# Patient Record
Sex: Female | Born: 1955 | Hispanic: Yes | Marital: Married | Smoking: Never smoker
Health system: Southern US, Community
[De-identification: ages and names within clinical notes are randomized; demographics above are authoritative.]

## PROBLEM LIST (undated history)

## (undated) DIAGNOSIS — R7303 Prediabetes: Secondary | ICD-10-CM

## (undated) DIAGNOSIS — I10 Essential (primary) hypertension: Secondary | ICD-10-CM

## (undated) DIAGNOSIS — F419 Anxiety disorder, unspecified: Secondary | ICD-10-CM

## (undated) DIAGNOSIS — K2970 Gastritis, unspecified, without bleeding: Secondary | ICD-10-CM

## (undated) DIAGNOSIS — K219 Gastro-esophageal reflux disease without esophagitis: Secondary | ICD-10-CM

## (undated) HISTORY — PX: ABDOMINAL HYSTERECTOMY: SHX81

---

## 2005-08-19 ENCOUNTER — Ambulatory Visit: Payer: Self-pay | Admitting: Unknown Physician Specialty

## 2005-08-21 ENCOUNTER — Ambulatory Visit: Payer: Self-pay | Admitting: Unknown Physician Specialty

## 2008-08-02 ENCOUNTER — Ambulatory Visit: Payer: Self-pay | Admitting: Unknown Physician Specialty

## 2008-09-27 ENCOUNTER — Ambulatory Visit: Payer: Self-pay

## 2010-03-28 ENCOUNTER — Ambulatory Visit: Payer: Self-pay

## 2010-10-04 ENCOUNTER — Ambulatory Visit: Payer: Self-pay | Admitting: Rheumatology

## 2010-10-15 ENCOUNTER — Ambulatory Visit: Payer: Self-pay | Admitting: Rheumatology

## 2010-10-15 IMAGING — NM NUCLEAR MEDICINE WHOLE BODY BONE SCINTIGRAPHY
1 series · 12 of 14 positions shown, 15 images · non-contrast
Comparison: none

REASON FOR EXAM: WITH SPECT  severe pain abn Tspine MRI
COMMENTS:

[Series 4: 3d bone 1.25 b70s · axial · 0.98mm/px · z∈[+1446,+1741]mm · 12 of 500 slices shown, 15 images]
[im 39/500  soft-tissue]
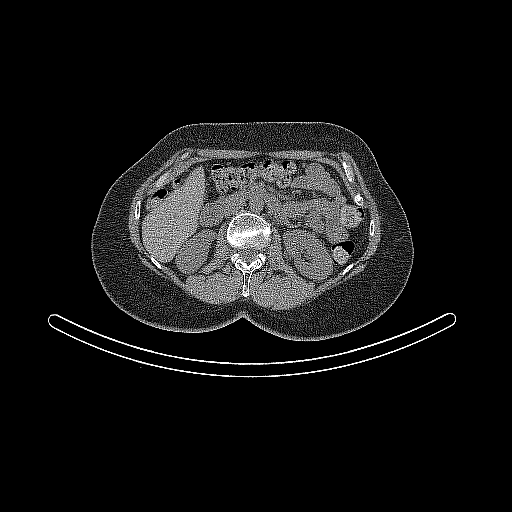
[im 39/500  bone]
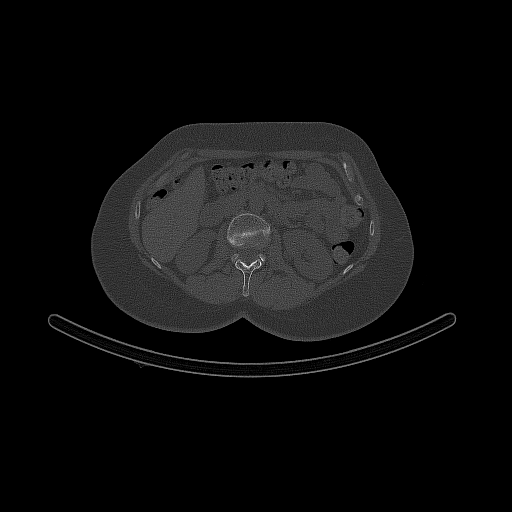
[im 77/500  bone]
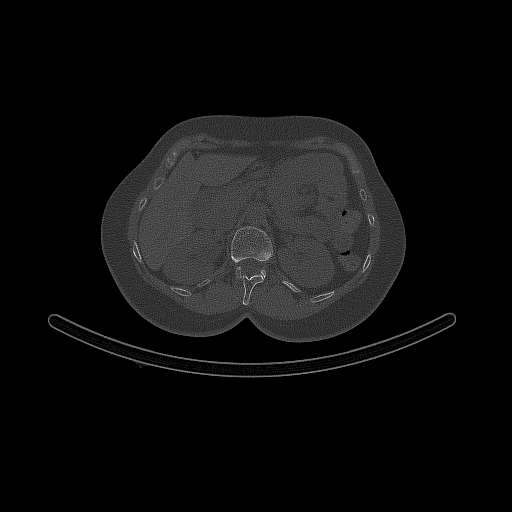
[im 116/500  bone]
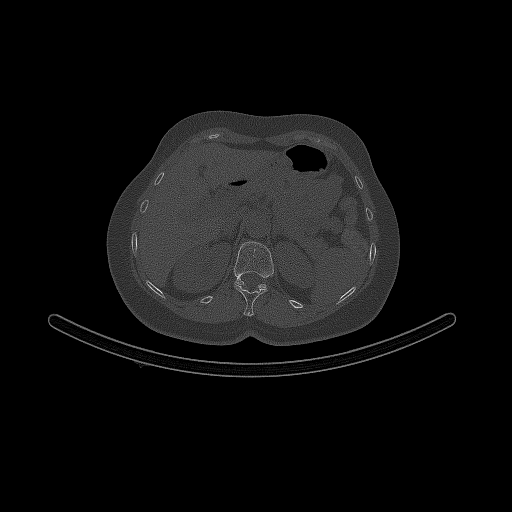
[im 154/500  bone]
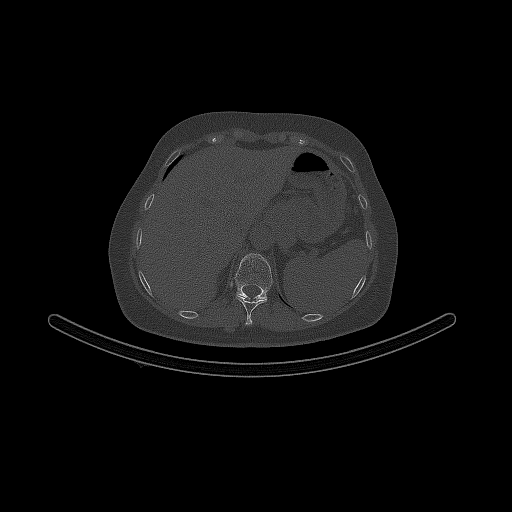
[im 192/500  soft-tissue]
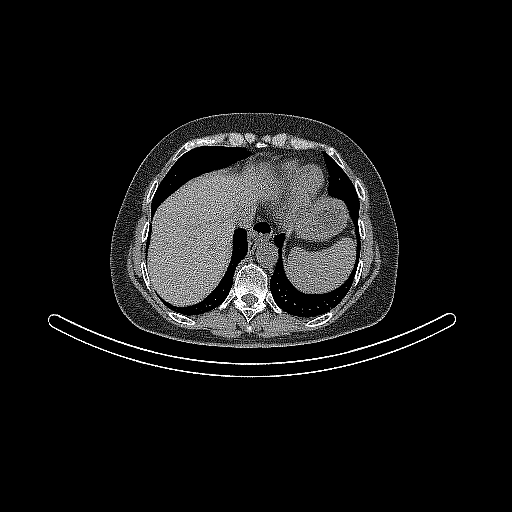
[im 192/500  bone]
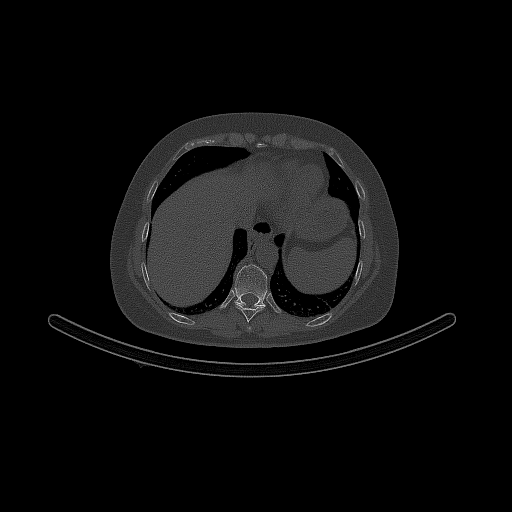
[im 231/500  bone]
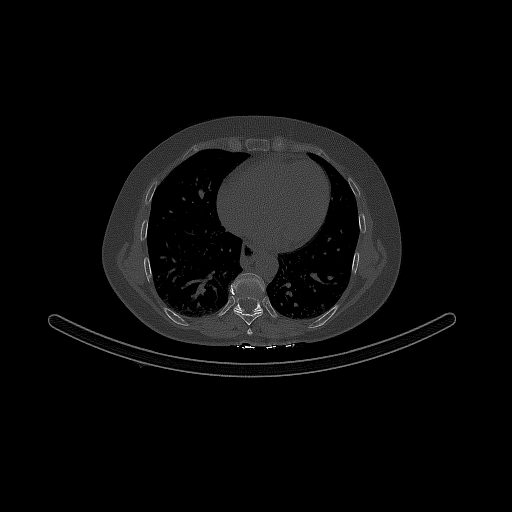
[im 269/500  bone]
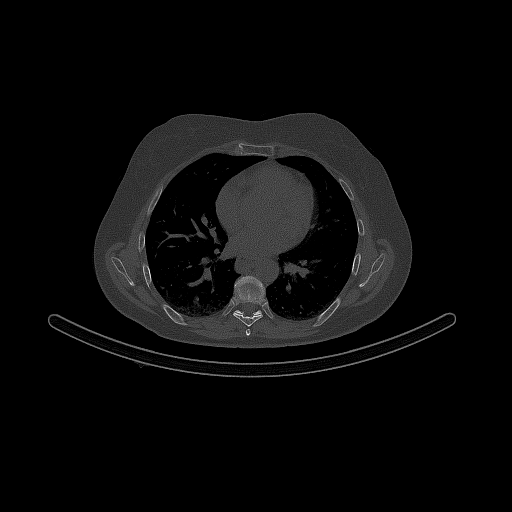
[im 308/500  bone]
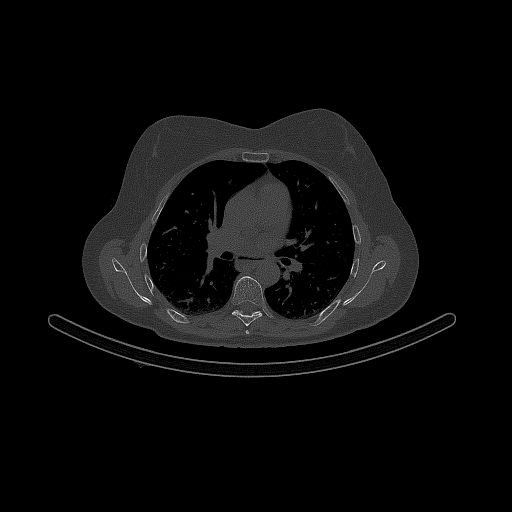
[im 346/500  soft-tissue]
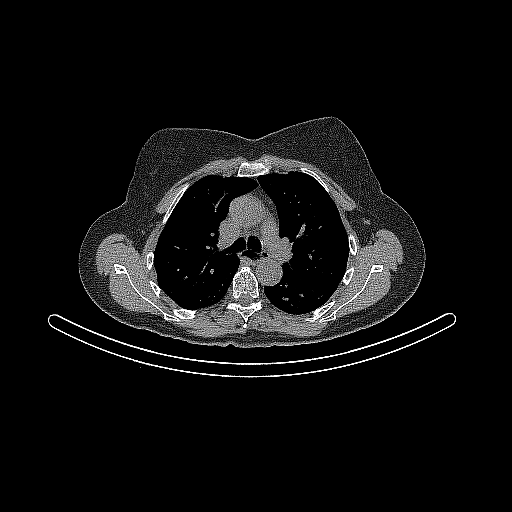
[im 346/500  bone]
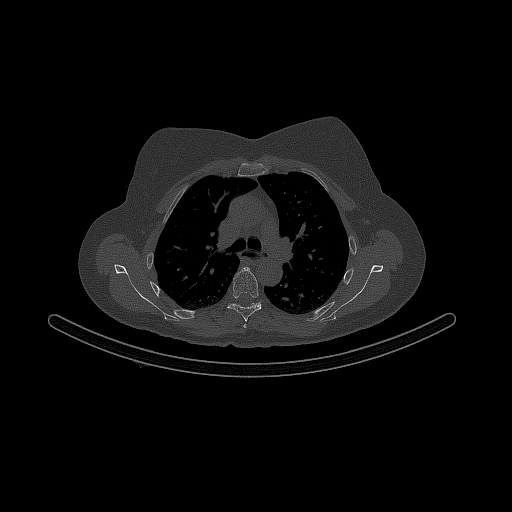
[im 384/500  bone]
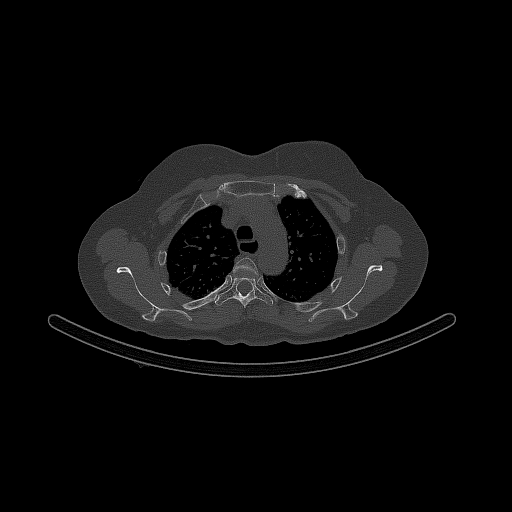
[im 423/500  bone]
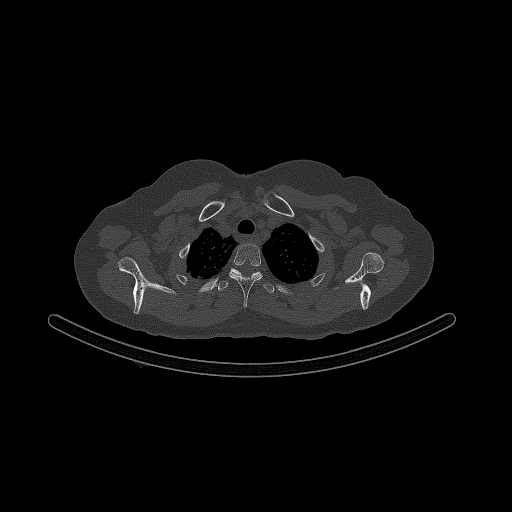
[im 461/500  bone]
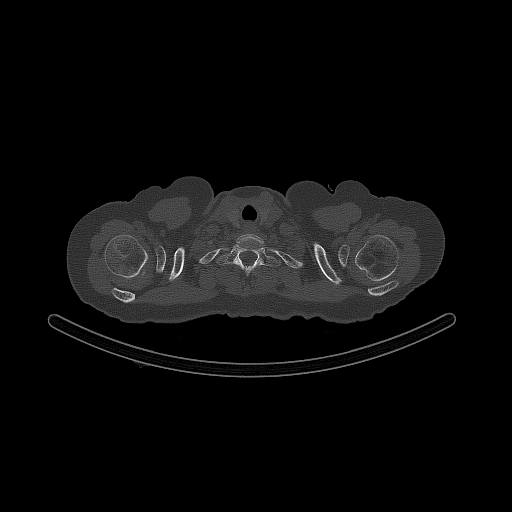

[12 of 14 positions shown; findings below may reference images not displayed]

PROCEDURE:     NM  - NM BONE WB 3 HR [DATE]  [DATE]

RESULT:     The patient was given a dose of 22.66 mCi of technetium 99m MDP.
Anterior and posterior whole body images with regional planar images in the
lateral view over the head and neck and oblique views over the abdomen and
pelvis were obtained. SPECT CT is performed over the thoracic region.
FINDINGS: Images show degenerative changes in the thoracic spine without
evidence of compression fracture or bony destruction. The bones do appear to
be somewhat osteopenic. Degenerative type increased localization is present
within the right ankle. There is no evidence of malignant uptake. There is
slightly increased localization in the ethmoid region consistent with sinus
disease. Degenerative uptake is seen in the shoulders.

Note is made of mild increased localization on the oblique views along the
superior aspect of the sacroiliac joints and again, most likely degenerative.
IMPRESSION: No evidence of thoracic compression deformity or
significant abnormal bony localization. Degenerative changes are noted in
the spine, shoulders and right ankle as well as the sacroiliac joints.

## 2011-04-15 ENCOUNTER — Observation Stay: Payer: Self-pay | Admitting: Internal Medicine

## 2011-04-15 LAB — CBC
HGB: 12.8 g/dL (ref 12.0–16.0)
MCH: 26.7 pg (ref 26.0–34.0)
MCHC: 32.5 g/dL (ref 32.0–36.0)
MCV: 82 fL (ref 80–100)
RBC: 4.8 10*6/uL (ref 3.80–5.20)
WBC: 5.9 10*3/uL (ref 3.6–11.0)

## 2011-04-15 LAB — COMPREHENSIVE METABOLIC PANEL
Alkaline Phosphatase: 55 U/L (ref 50–136)
Anion Gap: 13 (ref 7–16)
Bilirubin,Total: 0.8 mg/dL (ref 0.2–1.0)
Calcium, Total: 8.8 mg/dL (ref 8.5–10.1)
Co2: 27 mmol/L (ref 21–32)
Creatinine: 0.67 mg/dL (ref 0.60–1.30)
EGFR (Non-African Amer.): >60
Osmolality: 280 (ref 275–301)
Potassium: 3.9 mmol/L (ref 3.5–5.1)
SGPT (ALT): 21 U/L
Sodium: 141 mmol/L (ref 136–145)

## 2011-04-15 LAB — TROPONIN I
Troponin-I: <0.02 ng/mL
Troponin-I: <0.02 ng/mL

## 2011-04-15 LAB — CK TOTAL AND CKMB (NOT AT ARMC)
CK, Total: 74 U/L (ref 21–215)
CK, Total: 78 U/L (ref 21–215)
CK-MB: <0.5 ng/mL — ABNORMAL LOW (ref 0.5–3.6)

## 2011-04-15 LAB — TSH: Thyroid Stimulating Horm: 3.53 u[IU]/mL

## 2011-04-16 LAB — CBC WITH DIFFERENTIAL/PLATELET
Basophil %: 0.2 %
Eosinophil %: 0.2 %
HCT: 39.2 % (ref 35.0–47.0)
HGB: 12.9 g/dL (ref 12.0–16.0)
Lymphocyte #: 1.7 10*3/uL (ref 1.0–3.6)
Lymphocyte %: 21.3 %
MCH: 27.1 pg (ref 26.0–34.0)
MCV: 82 fL (ref 80–100)
Monocyte #: 0.7 10*3/uL (ref 0.0–0.7)
Monocyte %: 8.2 %
Neutrophil #: 5.7 10*3/uL (ref 1.4–6.5)
Platelet: 209 10*3/uL (ref 150–440)
RBC: 4.77 10*6/uL (ref 3.80–5.20)
RDW: 14.2 % (ref 11.5–14.5)
WBC: 8.1 10*3/uL (ref 3.6–11.0)

## 2011-04-16 LAB — BASIC METABOLIC PANEL
Anion Gap: 12 (ref 7–16)
Calcium, Total: 8.7 mg/dL (ref 8.5–10.1)
Chloride: 105 mmol/L (ref 98–107)
Co2: 28 mmol/L (ref 21–32)
EGFR (Non-African Amer.): >60
Osmolality: 287 (ref 275–301)
Potassium: 3.8 mmol/L (ref 3.5–5.1)

## 2011-04-16 LAB — TROPONIN I: Troponin-I: <0.02 ng/mL

## 2011-04-16 LAB — CK TOTAL AND CKMB (NOT AT ARMC): CK-MB: 0.6 ng/mL (ref 0.5–3.6)

## 2011-09-25 ENCOUNTER — Emergency Department: Payer: Self-pay | Admitting: Unknown Physician Specialty

## 2011-09-25 LAB — URINALYSIS, COMPLETE
Glucose,UR: NEGATIVE mg/dL (ref 0–75)
Nitrite: NEGATIVE
Ph: 8 (ref 4.5–8.0)
Protein: NEGATIVE
RBC,UR: <1 /HPF (ref 0–5)
Specific Gravity: 1.003 (ref 1.003–1.030)
WBC UR: 2 /HPF (ref 0–5)

## 2011-09-25 LAB — TROPONIN I: Troponin-I: <0.02 ng/mL

## 2011-09-25 LAB — CBC
MCH: 26.5 pg (ref 26.0–34.0)
MCHC: 32.5 g/dL (ref 32.0–36.0)
MCV: 82 fL (ref 80–100)
Platelet: 212 10*3/uL (ref 150–440)
RBC: 4.8 10*6/uL (ref 3.80–5.20)
RDW: 13.7 % (ref 11.5–14.5)

## 2011-09-25 LAB — COMPREHENSIVE METABOLIC PANEL
Alkaline Phosphatase: 85 U/L (ref 50–136)
Anion Gap: 10 (ref 7–16)
Bilirubin,Total: 0.7 mg/dL (ref 0.2–1.0)
Co2: 25 mmol/L (ref 21–32)
Creatinine: 0.71 mg/dL (ref 0.60–1.30)
EGFR (Non-African Amer.): >60
Osmolality: 278 (ref 275–301)
Sodium: 140 mmol/L (ref 136–145)

## 2012-04-23 ENCOUNTER — Emergency Department: Payer: Self-pay | Admitting: Emergency Medicine

## 2012-04-23 LAB — COMPREHENSIVE METABOLIC PANEL WITH GFR
Albumin: 3.6 g/dL
Alkaline Phosphatase: 88 U/L
Anion Gap: 5 — ABNORMAL LOW
BUN: 10 mg/dL
Bilirubin,Total: 0.9 mg/dL
Calcium, Total: 8.4 mg/dL — ABNORMAL LOW
Chloride: 107 mmol/L
Co2: 29 mmol/L
Creatinine: 0.6 mg/dL
EGFR (African American): >60
EGFR (Non-African Amer.): >60
Glucose: 116 mg/dL — ABNORMAL HIGH
Osmolality: 281
Potassium: 3.4 mmol/L — ABNORMAL LOW
SGOT(AST): 34 U/L
SGPT (ALT): 37 U/L
Sodium: 141 mmol/L
Total Protein: 7.6 g/dL

## 2012-04-23 LAB — CBC
HGB: 13 g/dL (ref 12.0–16.0)
MCH: 26.9 pg (ref 26.0–34.0)
MCHC: 33.1 g/dL (ref 32.0–36.0)
MCV: 81 fL (ref 80–100)
RBC: 4.82 10*6/uL (ref 3.80–5.20)
WBC: 9.5 10*3/uL (ref 3.6–11.0)

## 2012-05-05 ENCOUNTER — Emergency Department: Payer: Self-pay | Admitting: Emergency Medicine

## 2012-05-05 LAB — COMPREHENSIVE METABOLIC PANEL
Albumin: 4.1 g/dL (ref 3.4–5.0)
Alkaline Phosphatase: 89 U/L (ref 50–136)
Anion Gap: 4 — ABNORMAL LOW (ref 7–16)
Bilirubin,Total: 0.9 mg/dL (ref 0.2–1.0)
Calcium, Total: 9.2 mg/dL (ref 8.5–10.1)
Chloride: 107 mmol/L (ref 98–107)
Co2: 29 mmol/L (ref 21–32)
Creatinine: 0.71 mg/dL (ref 0.60–1.30)
EGFR (African American): >60
EGFR (Non-African Amer.): >60
Glucose: 128 mg/dL — ABNORMAL HIGH (ref 65–99)
Osmolality: 279 (ref 275–301)
SGOT(AST): 28 U/L (ref 15–37)
Sodium: 140 mmol/L (ref 136–145)

## 2012-05-05 LAB — CBC
HGB: 14.3 g/dL (ref 12.0–16.0)
MCH: 27 pg (ref 26.0–34.0)
MCHC: 33 g/dL (ref 32.0–36.0)
MCV: 82 fL (ref 80–100)
RBC: 5.3 10*6/uL — ABNORMAL HIGH (ref 3.80–5.20)

## 2012-05-05 LAB — TROPONIN I
Troponin-I: <0.02 ng/mL
Troponin-I: <0.02 ng/mL

## 2012-05-05 LAB — CK TOTAL AND CKMB (NOT AT ARMC): CK, Total: 65 U/L (ref 21–215)

## 2013-01-23 ENCOUNTER — Emergency Department: Payer: Self-pay | Admitting: Emergency Medicine

## 2013-01-23 LAB — CBC
HCT: 39.9 % (ref 35.0–47.0)
HGB: 13.5 g/dL (ref 12.0–16.0)
MCH: 27.5 pg (ref 26.0–34.0)
MCHC: 33.7 g/dL (ref 32.0–36.0)
MCV: 82 fL (ref 80–100)
Platelet: 157 10*3/uL (ref 150–440)
RBC: 4.9 10*6/uL (ref 3.80–5.20)
RDW: 13.9 % (ref 11.5–14.5)

## 2013-01-23 LAB — BASIC METABOLIC PANEL
Anion Gap: 7 (ref 7–16)
BUN: 4 mg/dL — ABNORMAL LOW (ref 7–18)
Creatinine: 0.94 mg/dL (ref 0.60–1.30)
Potassium: 3.6 mmol/L (ref 3.5–5.1)
Sodium: 136 mmol/L (ref 136–145)

## 2014-05-22 NOTE — Consult Note (Signed)
PATIENT NAME:  Sarah Tapia, Sarah Tapia MR#:  [id number] DATE OF BIRTH:  03-12-1955  DATE OF CONSULTATION:  04/16/2011  REFERRING PHYSICIAN:  Dr. Delfino Tapia  CONSULTING PHYSICIAN:  Sarah Canche K. Sarah Burger, MD  PRIMARY CARE PHYSICIAN: Dr. Terance Tapia  REASON FOR CONSULTATION: Syncope.   HISTORY OF PRESENT ILLNESS: Sarah Tapia is a 59 year old right-handed Hispanic female who was working at [employer] and felt like some unusual sensation, felt like she was going to urinate and defecate on herself and passed out. She had some loss of consciousness for unknown period of time per her. The last thing she remembers is coworkers around her and once she came around the first thing she remembers is laying in the ambulance and she had on and off consciousness. She remembers here in the ER the events from being in the ER.   Patient had CT scan of the head done which was unremarkable.   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 She does not remember the details. Her parents have died and the physician taking care of her also died in [place]   Patient does not remember the name of the medication but she was supposed to be on seizure medication and she used to have generalized tonic-clonic seizures.   Patient does have occasional episodes of feeling of loss of time but she has not had episode waking up in unusual places or unexplained injuries or tongue bites, etc.   During this particular episode she did not have any tongue bite or loss of bowel or bladder.   She does not have any birthmark except she has interscapular skin changes which has been growing in size so she is going to see a dermatologist as an outpatient.   Patient also had a mass on her shin of her leg which was recently removed which was supposedly benign.    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   She felt like the surgery at [facility] did help her.   She has severe osteoporosis and receives IV Reclast.   Patient does not have family history of epilepsy or mental condition or any other neurological condition and she felt like her birth was normal. She did receive all immunizations.    Her development was okay. She does not have any other birthmarks as described above.   She also has a history of occasional dizziness off and on with just room spinning sensation and it has been going on for long time. It is not associated with nausea and vomiting.   Patient also carries a diagnosis of anxiety and depression and takes medication for this.   PAST MEDICAL HISTORY:  1. Osteoporosis.  2. Hypertension.  3. Gastroesophageal reflux disease.  4. Chronic seasonal allergies.   PAST SURGICAL HISTORY:  1. Left leg benign mass removal.  2. Status post esophagectomy.   ALLERGIES: She is allergic to penicillin and aspirin.   CURRENT MEDICATION LIST: 1. Flonase. 2. Vicodin p.r.n.   SOCIAL HISTORY: Significant that she does not smoke, does not drink alcohol, never did drugs. She works at [employer].   FAMILY HISTORY: Significant that mother and father had coronary disease and they have been deceased as well as diabetes.   REVIEW OF SYSTEMS: Positive for feeling generalized tiredness and feeling of dizziness and dryness in her mouth, just feels uneasy  and head heavy sensation. She does not have generalized aching. Her 10 system review of system was asked and was found to be negative.   PHYSICAL EXAMINATION:  VITAL SIGNS: Temperature 97.7, pulse 68, respiratory rate 20, blood pressure 124/80, pulse oximetry 97%.   She is alert, oriented. She followed two-step inverted commands. Her attention, concentration, and memory seems to be appropriate for her age and medical condition.   She does not have any delusions, hallucinations.   She does have good mood. She did have some  language fluency problem but I think it is more likely due to her using English as a second language.   CRANIAL NERVES: Pupils were equal, round, and reactive. Extraocular movements were intact. Her face was symmetric. Tongue was midline. Facial sensations were intact. Her hearing seemed to be intact.   On her motor exam she has normal tone and strength of 5/5.   She has deep tendon reflexes +2. She has negative Hoffman. Her toes are mute.   I did not check her gait but her coordination seems to be intact.   GENERAL: She does have a small scar on her shin on the left side as well as she has abnormal pigmentation between her intrascapular region.   She has abdominal scars old healed from her esophageal surgery.   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   She does not have myoclonic jerk or any neurocutaneous stigmata.   She does not have positive family history of seizures.   But she has episodes of loss of time before this event and some type of aura. Cannot rule out seizure as a cause of her passing out spells.   We should consider doing the work-up as Sarah Tapia from the hospitalist team has thought of. I agree with doing MRI of the brain epilepsy protocol and EEG.   This test can be done as an outpatient visit.   For now will start her on levetiracetam 500 mg twice a day.   Patient has all the labs done which have been unremarkable so far.   Patient should be advised on driving restriction for at least six months since her first episode.   It is not clear at this point that her spell represented seizure.   In two weeks at her clinic visit we will make further decision.   As a part of her syncopal work-up she is wearing Holter monitor. She should have orthostatic vitals checked.   I will see her as an outpatient in two weeks. Feel free to contact me with any further  questions.  ____________________________ Durene CalHemang K. Sarah BurgerShah, MD hks:cms D: 04/16/2011 21:59:11 ET T: 04/17/2011 09:57:55 ET JOB#: [id number]  cc: Teagen Mcleary K. Sarah BurgerShah, MD, <Dictator> Durene CalHEMANG K [facility] MD ELECTRONICALLY SIGNED 04/29/2011 15:06

## 2014-05-22 NOTE — Consult Note (Signed)
Past Medical/Surgical Hx:  Osteoporosis:   Esophageal Dilation:   Hysterectomy - Partial:   Home Medications: Medication Instructions Last Modified Date/Time  Reclast 5 mg/100 mL intravenous solution  intravenous  18-Mar-13 11:47  Os-Cal 500 (1250 mg calcium carbonate) oral tablet tab(s) orally once a day 18-Mar-13 11:47  Multi-Day Plus Minerals oral tablet 1 tab(s) orally once a day 18-Mar-13 18:51  Vitamin D3 2000 intl units oral tablet 1 tab(s) orally once a day 18-Mar-13 18:51  Flonase 50 mcg/inh nasal spray 1 spray(s) nasal once a day (at bedtime) 18-Mar-13 18:51   Allergies:  Aspirin: Unknown  Penicillin: Unknown  Vital Signs: **Vital Signs.:   19-Mar-13 14:09   Vital Signs Type Routine   Temperature Temperature (F) 97.6   Celsius 36.4   Temperature Source oral   Pulse Pulse 72   Pulse source per Dinamap   Respirations Respirations 20   Systolic BP Systolic BP 494   Diastolic BP (mmHg) Diastolic BP (mmHg) 92   Mean BP 106   BP Source Dinamap   Pulse Ox % Pulse Ox % 98   Pulse Ox Activity Level  At rest   Oxygen Delivery Room Air/ 21 %   Lab Results:  Thyroid:  18-Mar-13 12:05    Thyroid Stimulating Hormone 3.53  Hepatic:  18-Mar-13 12:05    Bilirubin, Total 0.8   Alkaline Phosphatase 55   SGPT (ALT) 21   SGOT (AST) 26   Total Protein, Serum 7.9   Albumin, Serum 4.0  Routine Chem:  19-Mar-13 05:18    Glucose, Serum   101   BUN 7   Creatinine (comp) 0.68   Sodium, Serum 145   Potassium, Serum 3.8   Chloride, Serum 105   CO2, Serum 28   Calcium (Total), Serum 8.7   Anion Gap 12   Osmolality (calc) 287   eGFR (African American) >60   eGFR (Non-African American) >60   Hemoglobin A1c ([facility]) 5.8  Cardiac:  19-Mar-13 05:18    Troponin I < 0.02   CK, Total 68   CPK-MB, Serum 0.6  Routine Hem:  19-Mar-13 05:18    WBC (CBC) 8.1   RBC (CBC) 4.77   Hemoglobin (CBC) 12.9   Hematocrit (CBC) 39.2   Platelet Count (CBC) 209   MCV 82   MCH 27.1   MCHC  33.0   RDW 14.2   Neutrophil % 70.1   Lymphocyte % 21.3   Monocyte % 8.2   Eosinophil % 0.2   Basophil % 0.2   Neutrophil # 5.7   Lymphocyte # 1.7   Monocyte # 0.7   Eosinophil # 0.0   Basophil # 0.0   Radiology Results: [place]:    18-Mar-13 17:03, US Carotid Doppler Bilateral   US Carotid Doppler Bilateral    REASON FOR EXAM:    syncope  COMMENTS:       PROCEDURE: [place]  - US CAROTID DOPPLER BILATERAL  - Apr 15 2011  5:03PM     RESULT: Indication: Syncope    Comparison: None    Technique: Gray-scale, color Doppler, and spectral Doppler images were   obtainedof the extracranial carotid artery systems and vertebral   arteries in the neck.    Findings:  On the right, there is no significant atherosclerotic plaque. Maximum     peak systolic velocity in the right CCA is 70 cm/second. Maximum peak   systolic velocity in the right ICA is 84 cm/second. Maximum peak systolic   velocity in the right  ECA is 78 cm/second. The right ICA/CCA ratio is   1.26. This corresponds to a stenosis of less than 50 %. Antegrade blood   flow is documented in the right vertebral artery.    On the left, there is no significant atherosclerotic plaque. Maximum peak   systolic velocity in the left CCA is 86 cm/second. Maximum peak systolic   velocity in the left ICA is 97 cm/second. Maximum peak systolic velocity   in the left ECA is 58 cm/second. The left ICA/CCA ratio is 1.04. This   corresponds to a stenosis of less than 50 %. Antegrade blood flow is   documented in the left vertebral artery.    IMPRESSION:    1. No hemodynamically significant carotid artery stenosis.    Verified By: Jennette Banker, M.D., MD  CT:    18-Mar-13 16:26, CT Head Without Contrast   CT Head Without Contrast    REASON FOR EXAM:    syncope  COMMENTS:       PROCEDURE: CT  - CT HEAD WITHOUT CONTRAST  - Apr 15 2011  4:26PM     RESULT: Comparison:  None    Technique: Multiple axial images from the foramen magnum to the  vertex   were obtained without IV contrast.    Findings:      There is no evidence of mass effect, midline shift, or extra-axial fluid   collections.  There is no evidence of a space-occupying lesion or   intracranial hemorrhage. There is no evidence of a cortical-based area of     acute infarction.      The ventricles and sulci are appropriate for the patient's age. The basal   cisterns are patent.    Visualized portions of the orbits are unremarkable. The visualized   portions of the paranasal sinuses and mastoid air cells are unremarkable.     The osseous structures are unremarkable.    IMPRESSION:      No acute intracranial process.        Verified By: Jennette Banker, M.D., MD   Impression/Recommendations:  Recommendations:   Please see my dictation for details.[id number] Syncopal spell, can't rule out seizure, will start levetiracetam 500 mg po bid empirically. 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 for the opportunity to participate in care of your patient.free to contact me with any further questions on this pt.will follow this patient with you during their hospitalization.   Electronic Signatures: Ray Church (MD)  (Signed 19-Mar-13 21:59)  Authored: PAST MEDICAL/SURGICAL HISTORY, HOME MEDICATIONS, ALLERGIES, NURSING VITAL SIGNS, LAB RESULTS, RADIOLOGY RESULTS, Recommendations   Last Updated: 19-Mar-13 21:59 by Ray Church (MD)

## 2014-05-22 NOTE — Discharge Summary (Signed)
PATIENT NAME:  Sarah PlowmanCRUZ, Niaya MR#:  [id number] DATE OF BIRTH:  [birth year]  DATE OF ADMISSION:  04/15/2011 DATE OF DISCHARGE:  04/17/2011  DISCHARGE DIAGNOSES:  1. Syncope. Cannot rule out possible seizure. Complete neurological work-up including magnetic resonance imaging of the brain, electroencephalogram and echocardiogram remained negative.  2. Hyperglycemia, likely stress-induced, hemoglobin A1c less than 6.    SECONDARY DIAGNOSES:  1. Osteoporosis.  2. Hypertension.  3. Gastroesophageal reflux disease.   CONSULTATION: Neurology Dr. Cristopher PeruHemang Horton [place].   LABORATORY, DIAGNOSTIC AND RADIOLOGICAL DATA: EEG on 03/20 showed normal record, no obvious seizure activity.   CT scan of the head without contrast on 03/18 showed no acute intracranial process.   Carotid Doppler's bilaterally on 03/18 showed no hemodynamically significant carotid stenosis.   MRI of the brain without contrast on 03/20 showed no acute abnormality.   2-D echocardiogram on 03/19 showed normal echocardiogram. Trace mitral regurgitation.   HISTORY AND SHORT HOSPITAL COURSE: Patient is a 59 year old female with above-mentioned medical problems was admitted for possible syncope. She was felt to have possible vertigo, was started on meclizine but did not get much benefit. Please see Dr. Serita GritShreyang Patel's dictated history and physical for further details. On further evaluation it was found to be known history of seizure as a child and was on medication until high school and her history was concerning for possible seizure so neurology consultation was obtained with Dr. Cristopher PeruHemang Rian Koon who recommended full neurological work-up which was obtained with EEG, MRI and bilateral carotid Doppler's which were all negative. She was started on Keppra, was feeling much better on 03/20 and is being discharged home in stable condition. On the date of discharge she was not having dizzy spell and was feeling close to baseline.   PHYSICAL EXAMINATION:  VITAL  SIGNS: On the date of discharge her vital signs are as follows: Temperature 97.8, heart rate 71 per minute, respirations 18 per minute, blood pressure 146/90 mmHg. She was saturating 99% on room air. Pertinent Physical Examination: CARDIOVASCULAR: S1, S2 normal. No murmur, rubs, or gallop. LUNGS: Clear to auscultation bilaterally. No wheezing, rales, rhonchi, crepitation. ABDOMEN: Soft, benign. NEUROLOGIC: Nonfocal examination. All other physical examination remained at the baseline.   DISCHARGE MEDICATIONS:  1. Reclast 5 mg IV as she has been taking at home.  2. Os-Cal 500 mg 1 tablet p.o. daily.  3. Multivitamin once daily.  4. Vitamin D3 2000 international units once daily.  5. Flonase one spray at bedtime.  6. Keppra 500 mg p.o. b.i.d.   DISCHARGE DIET: Regular.   DISCHARGE ACTIVITY: As tolerated.   DISCHARGE INSTRUCTIONS AND FOLLOW UP: Patient was instructed to follow up with her primary care physician, Dr. Dorothey Basemanavid Bronstein, in 2 to 3 weeks. She will need follow up with Dr. Crist[facility] PeruHemang [facility] as scheduled.   TOTAL TIME DISCHARGING THIS PATIENT: 55 minutes.   ____________________________ Ellamae SiaVipul S. Sherryll BurgerShah, MD vss:cms D: 04/17/2011 21:45:23 ET T: 04/18/2011 10:43:25 ET JOB#: [id number]  cc: Sota Hetz S. Sherryll BurgerShah, MD, <Dictator> Teena Iraniavid M. Terance HartBronstein, MD Hemang K. Sherryll BurgerShah, MD Ellamae SiaVIPUL S [facility] MD ELECTRONICALLY SIGNED 04/18/2011 12:11

## 2014-05-22 NOTE — H&P (Signed)
PATIENT NAME:  Sarah Tapia, PETRIDES MR#:  [id number] DATE OF BIRTH:  10-19-1955  DATE OF ADMISSION:  04/15/2011  PRIMARY CARE PHYSICIAN: Dr. Terance Hart  ED REFERRING PHYSICIAN: Dr. Clemens Catholic   CHIEF COMPLAINT: Syncope with collapse.   HISTORY OF PRESENT ILLNESS: The patient is a 59 year old Spanish female who reports that she has had a problem with her blood pressure going up and down over the past few months. She has been followed by Dr. Terance Hart, is not currently on any regimen. She reports that she has also been feeling dizzy for the past few months. She starts feeling like the room is a spinning and feels like passing out but she never passes out. There is no relationship to a standing position or sitting or lying. She gets these symptoms. She also has been weak and sleepy over the past few weeks. She today was working at [employer] when she started to have this spell where she started to feel dizzy and felt like the room was spinning. She felt like she was going to have urine and stool incontinence and then passed out. There was no noted seizure activity. The patient did pass out and apparently her coworker was nearby and did not let her fall. EMS was called. She started waking up later once EMS picked her up. There was no seizure activity noted. She also complains of some headache. She denies any visual difficulties. No double vision. She denies any ringing in her ears. No hearing difficulties. She denies any chest pains. She reports that when she starts feeling dizzy she starts having a feeling of shortness of breath.  She denies any abdominal pain, nausea, vomiting, or diarrhea. She denies any urinary frequency, urgency, or hesitancy. She complains of having pain in her left lower leg where she had a lesion that was benign, was resected. She otherwise denies any urinary symptoms.   PAST MEDICAL HISTORY:  1. Osteoporosis.  2. Hypertension.  3. Gastroesophageal reflux disease.  4. Chronic seasonal allergies.    PAST SURGICAL HISTORY:  1. History of surgery for left leg mass which was benign.  2. Status post esophagectomy.  ALLERGIES: Penicillin and aspirin.   CURRENT MEDICATIONS: She reports that she only takes Flonase and Vicodin p.r.n.   SOCIAL HISTORY: Does not smoke. Does not drink. No drugs.   FAMILY HISTORY: Mother and father died of coronary artery disease and diabetes.   REVIEW OF SYSTEMS:  CONSTITUTIONAL: Denies any fevers. Complains of fatigue, weakness. No pain. No weight loss. No weight gain. EYES: No blurred or double vision. No pain. No redness. No inflammation. No glaucoma. No cataracts. ENT: No tinnitus. No ear pain. No hearing loss. No seasonal or year-round allergies. No epistaxis. No nasal discharge. No snoring. No postnasal drip. No sinus pain. No redness of the oropharynx. No difficulty swallowing. RESPIRATORY: No cough. No wheezing. No hemoptysis. No dyspnea. No painful respirations. No chronic obstructive pulmonary disease. No tuberculosis. No pneumonia. CARDIOVASCULAR: No chest pain. No orthopnea. No edema. No arrhythmia. No palpitations. Syncope as above. GASTROINTESTINAL: No nausea, vomiting, or diarrhea. No abdominal pain. No hematemesis. No melena. No ulcers. No gastroesophageal reflux disease. No irritable bowel syndrome. No jaundice. No changes in bowel habits. GENITOURINARY: Denies any dysuria, hematuria, renal calculus, or frequency. ENDO: Denies any polyuria, nocturia, or thyroid problems. HEME/LYMPH: Denies any anemia, easy bruisability, or bleeding. SKIN: No acne. No rash. No changes in mole, hair, or skin. MUSCULOSKELETAL: Complains of pain in her back related, according to her, to osteoporosis. No  gout. NEUROLOGIC: No numbness. No weakness. No cerebrovascular accident. No transient ischemic attack. No seizures. PSYCHIATRIC: Denies anxiety, insomnia, or ADD. No OCD. No bipolar depression.   PHYSICAL EXAMINATION:  VITAL SIGNS: Temperature 98, pulse 82, respirations 18,  blood pressure 158/93, O2 99%.   GENERAL: The patient appears her stated age in no acute distress.   HEENT: Head atraumatic, normocephalic. Pupils are equal, round, reactive to light and accommodation. There is no conjunctival pallor. No scleral icterus. She has no nystagmus but with vertical eye movements she does start feeling dizzy. Oropharynx is clear without any exudates. Nasal exam shows no ulceration or drainage. Ear exam shows no drainage or erythema.   NECK: No thyromegaly. No carotid bruits.   CARDIOVASCULAR: Regular rate and rhythm. No murmurs, rubs, clicks, or gallops. PMI is not displaced.   LUNGS: Clear to auscultation bilaterally without any rales, rhonchi, or wheezing.   ABDOMEN: Soft, nontender, nondistended. Positive bowel sounds times four.   EXTREMITIES: No clubbing, cyanosis, or edema.   NEUROLOGICAL: The patient is awake, alert, oriented times three. No focal deficits.   SKIN: No rash.   LYMPHATIC: No lymph nodes palpable.   VASCULAR: Good DP, PT pulses.   PSYCHIATRIC: Not anxious or depressed.   LABORATORY, DIAGNOSTIC, AND RADIOLOGICAL DATA: Blood glucose 121, BUN 6, creatinine 0.67, sodium 141, potassium 3.9, chloride 101, CO2 27, calcium 8.8. LFTs were normal. Troponin less than 0.02. WBC 5.9, hemoglobin 12.8, platelet count 223. EKG normal sinus rhythm without any ST-T wave changes.   ASSESSMENT AND PLAN: The patient is a 59 year old Spanish female who presents with syncope.  1. Syncope with collapse: At this time we will place the patient on observation. We will do an echocardiogram and check carotids. The patient also may need outpatient Holter.  2. Dizziness, vertigo: Possibly due to benign positional vertigo. We will try meclizine. May need outpatient ENT evaluation due to her symptom of collapse. We will go ahead and get a CT scan of the head. May need MRI of the brain.  3. Hypertension: Not on any treatment currently. We will monitor her blood pressure.   4. Slightly elevated blood glucose: We will check a hemoglobin A1c.  5. Osteoporosis: The patient gets IV infusion yearly.  6. Miscellaneous: We will hold off on any deep vein thrombosis prophylaxis since she is ambulatory.     TIME SPENT: 35 minutes.   ____________________________ Lacie ScottsShreyang H. Allena KatzPatel, MD shp:bjt D: 04/15/2011 16:24:14 ET T: 04/15/2011 17:13:03 ET JOB#: [id number]  cc: Sarahlynn Cisnero H. Allena KatzPatel, MD, <Dictator> Teena Iraniavid M. Terance HartBronstein, MD Charise CarwinSHREYANG H Emmajean Ratledge MD ELECTRONICALLY SIGNED 04/17/2011 21:59

## 2014-09-14 ENCOUNTER — Other Ambulatory Visit: Payer: Self-pay | Admitting: Obstetrics and Gynecology

## 2014-09-14 DIAGNOSIS — Z1231 Encounter for screening mammogram for malignant neoplasm of breast: Secondary | ICD-10-CM

## 2014-09-22 ENCOUNTER — Ambulatory Visit
Admission: RE | Admit: 2014-09-22 | Discharge: 2014-09-22 | Disposition: A | Discharge status: Home / Self Care | Payer: BLUE CROSS/BLUE SHIELD | Source: Ambulatory Visit | Attending: Obstetrics and Gynecology | Admitting: Obstetrics and Gynecology

## 2014-09-22 DIAGNOSIS — Z1231 Encounter for screening mammogram for malignant neoplasm of breast: Secondary | ICD-10-CM | POA: Diagnosis present

## 2014-10-04 DIAGNOSIS — Z8719 Personal history of other diseases of the digestive system: Secondary | ICD-10-CM | POA: Insufficient documentation

## 2014-10-04 DIAGNOSIS — K5900 Constipation, unspecified: Secondary | ICD-10-CM | POA: Insufficient documentation

## 2015-04-20 ENCOUNTER — Emergency Department
Admission: EM | Admit: 2015-04-20 | Discharge: 2015-04-20 | Disposition: A | Discharge status: Home / Self Care | Payer: BLUE CROSS/BLUE SHIELD | Attending: Emergency Medicine | Admitting: Emergency Medicine

## 2015-04-20 ENCOUNTER — Encounter: Payer: Self-pay | Admitting: Emergency Medicine

## 2015-04-20 DIAGNOSIS — R42 Dizziness and giddiness: Secondary | ICD-10-CM | POA: Diagnosis present

## 2015-04-20 DIAGNOSIS — I10 Essential (primary) hypertension: Secondary | ICD-10-CM | POA: Diagnosis not present

## 2015-04-20 HISTORY — DX: Essential (primary) hypertension: I10

## 2015-04-20 HISTORY — DX: Gastritis, unspecified, without bleeding: K29.70

## 2015-04-20 HISTORY — DX: Gastro-esophageal reflux disease without esophagitis: K21.9

## 2015-04-20 LAB — BASIC METABOLIC PANEL
ANION GAP: 6 (ref 5–15)
BUN: 7 mg/dL (ref 6–20)
CO2: 28 mmol/L (ref 22–32)
Calcium: 9.2 mg/dL (ref 8.9–10.3)
Chloride: 102 mmol/L (ref 101–111)
Creatinine, Ser: 0.62 mg/dL (ref 0.44–1.00)
GFR calc Af Amer: >60 mL/min (ref >60)
Glucose, Bld: 112 mg/dL — ABNORMAL HIGH (ref 65–99)
POTASSIUM: 3.9 mmol/L (ref 3.5–5.1)
SODIUM: 136 mmol/L (ref 135–145)

## 2015-04-20 LAB — URINALYSIS COMPLETE WITH MICROSCOPIC (ARMC ONLY)
BACTERIA UA: NONE SEEN
BILIRUBIN URINE: NEGATIVE
Glucose, UA: NEGATIVE mg/dL
HGB URINE DIPSTICK: NEGATIVE
Ketones, ur: NEGATIVE mg/dL
LEUKOCYTES UA: NEGATIVE
NITRITE: NEGATIVE
PH: 7 (ref 5.0–8.0)
Protein, ur: NEGATIVE mg/dL
SPECIFIC GRAVITY, URINE: 1.004 — AB (ref 1.005–1.030)

## 2015-04-20 LAB — CBC
HEMATOCRIT: 39.7 % (ref 35.0–47.0)
HEMOGLOBIN: 13.3 g/dL (ref 12.0–16.0)
MCH: 26.6 pg (ref 26.0–34.0)
MCHC: 33.4 g/dL (ref 32.0–36.0)
MCV: 79.5 fL — ABNORMAL LOW (ref 80.0–100.0)
Platelets: 239 10*3/uL (ref 150–440)
RBC: 5 MIL/uL (ref 3.80–5.20)
RDW: 14 % (ref 11.5–14.5)
WBC: 6.9 10*3/uL (ref 3.6–11.0)

## 2015-04-20 LAB — GLUCOSE, CAPILLARY: Glucose-Capillary: 97 mg/dL (ref 65–99)

## 2015-04-20 MED ORDER — MECLIZINE HCL 25 MG PO TABS: 25.0000 mg | ORAL_TABLET | Freq: Three times a day (TID) | ORAL | Status: DC | PRN

## 2015-04-20 MED ORDER — ALPRAZOLAM 0.5 MG PO TABS
0.5000 mg | ORAL_TABLET | Freq: Once | ORAL | Status: AC
  Administered 2015-04-20: 0.5 mg via ORAL

## 2015-04-20 MED ORDER — ONDANSETRON HCL 4 MG/2ML IJ SOLN
4.0000 mg | Freq: Once | INTRAMUSCULAR | Status: AC
  Administered 2015-04-20: 4 mg via INTRAVENOUS
  Filled 2015-04-20: qty 2

## 2015-04-20 MED ORDER — SODIUM CHLORIDE 0.9 % IV BOLUS (SEPSIS)
1000.0000 mL | Freq: Once | INTRAVENOUS | Status: AC
  Administered 2015-04-20: 1000 mL via INTRAVENOUS

## 2015-04-20 MED ORDER — ONDANSETRON HCL 4 MG PO TABS: 4.0000 mg | ORAL_TABLET | Freq: Three times a day (TID) | ORAL | Status: DC | PRN

## 2015-04-20 MED ORDER — ALPRAZOLAM 0.5 MG PO TABS
ORAL_TABLET | ORAL | Status: AC
  Administered 2015-04-20: 0.5 mg via ORAL
  Filled 2015-04-20: qty 1

## 2015-04-20 MED ORDER — MECLIZINE HCL 25 MG PO TABS
50.0000 mg | ORAL_TABLET | Freq: Once | ORAL | Status: AC
  Administered 2015-04-20: 50 mg via ORAL
  Filled 2015-04-20: qty 2

## 2015-04-20 NOTE — Discharge Instructions (Signed)
You were evaluated for dizziness which I suspect is due to peripheral vertigo.  Return to the emergency department for any worsening symptoms including any weakness, numbness, slurred speech, facial drooping, confusion or altered mental status, passing out, or seizure activity.   Benign Positional Vertigo Vertigo is the feeling that you or your surroundings are moving when they are not. Benign positional vertigo is the most common form of vertigo. The cause of this condition is not serious (is benign). This condition is triggered by certain movements and positions (is positional). This condition can be dangerous if it occurs while you are doing something that could endanger you or others, such as driving.  CAUSES In many cases, the cause of this condition is not known. It may be caused by a disturbance in an area of the inner ear that helps your brain to sense movement and balance. This disturbance can be caused by a viral infection (labyrinthitis), head injury, or repetitive motion. RISK FACTORS This condition is more likely to develop in:  Women.  People who are 58 years of age or older. SYMPTOMS Symptoms of this condition usually happen when you move your head or your eyes in different directions. Symptoms may start suddenly, and they usually last for less than a minute. Symptoms may include:  Loss of balance and falling.  Feeling like you are spinning or moving.  Feeling like your surroundings are spinning or moving.  Nausea and vomiting.  Blurred vision.  Dizziness.  Involuntary eye movement (nystagmus). Symptoms can be mild and cause only slight annoyance, or they can be severe and interfere with daily life. Episodes of benign positional vertigo may return (recur) over time, and they may be triggered by certain movements. Symptoms may improve over time. DIAGNOSIS This condition is usually diagnosed by medical history and a physical exam of the head, neck, and ears. You may be  referred to a health care provider who specializes in ear, nose, and throat (ENT) problems (otolaryngologist) or a provider who specializes in disorders of the nervous system (neurologist). You may have additional testing, including:  MRI.  A CT scan.  Eye movement tests. Your health care provider may ask you to change positions quickly while he or she watches you for symptoms of benign positional vertigo, such as nystagmus. Eye movement may be tested with an electronystagmogram (ENG), caloric stimulation, the Dix-Hallpike test, or the roll test.  An electroencephalogram (EEG). This records electrical activity in your brain.  Hearing tests. TREATMENT Usually, your health care provider will treat this by moving your head in specific positions to adjust your inner ear back to normal. Surgery may be needed in severe cases, but this is rare. In some cases, benign positional vertigo may resolve on its own in 2-4 weeks. HOME CARE INSTRUCTIONS Safety  Move slowly.Avoid sudden body or head movements.  Avoid driving.  Avoid operating heavy machinery.  Avoid doing any tasks that would be dangerous to you or others if a vertigo episode would occur.  If you have trouble walking or keeping your balance, try using a cane for stability. If you feel dizzy or unstable, sit down right away.  Return to your normal activities as told by your health care provider. Ask your health care provider what activities are safe for you. General Instructions  Take over-the-counter and prescription medicines only as told by your health care provider.  Avoid certain positions or movements as told by your health care provider.  Drink enough fluid to keep your urine  clear or pale yellow.  Keep all follow-up visits as told by your health care provider. This is important. SEEK MEDICAL CARE IF:  You have a fever.  Your condition gets worse or you develop new symptoms.  Your family or friends notice any  behavioral changes.  Your nausea or vomiting gets worse.  You have numbness or a "pins and needles" sensation. SEEK IMMEDIATE MEDICAL CARE IF:  You have difficulty speaking or moving.  You are always dizzy.  You faint.  You develop severe headaches.  You have weakness in your legs or arms.  You have changes in your hearing or vision.  You develop a stiff neck.  You develop sensitivity to light.   This information is not intended to replace advice given to you by your health care provider. Make sure you discuss any questions you have with your health care provider.   Document Released: 10/22/2005 Document Revised: 10/05/2014 Document Reviewed: 05/09/2014 Elsevier Interactive Patient Education 2016 [employer]  Dizziness Dizziness is a common problem. It is a feeling of unsteadiness or light-headedness. You may feel like you are about to faint. Dizziness can lead to injury if you stumble or fall. Anyone can become dizzy, but dizziness is more common in older adults. This condition can be caused by a number of things, including medicines, dehydration, or illness. HOME CARE INSTRUCTIONS Taking these steps may help with your condition: Eating and Drinking  Drink enough fluid to keep your urine clear or pale yellow. This helps to keep you from becoming dehydrated. Try to drink more clear fluids, such as water.  Do not drink alcohol.  Limit your caffeine intake if directed by your health care provider.  Limit your salt intake if directed by your health care provider. Activity  Avoid making quick movements.  Rise slowly from chairs and steady yourself until you feel okay.  In the morning, first sit up on the side of the bed. When you feel okay, stand slowly while you hold onto something until you know that your balance is fine.  Move your legs often if you need to stand in one place for a long time. Tighten and relax your muscles in your legs while you are standing.  Do  not drive or operate heavy machinery if you feel dizzy.  Avoid bending down if you feel dizzy. Place items in your home so that they are easy for you to reach without leaning over. Lifestyle  Do not use any tobacco products, including cigarettes, chewing tobacco, or electronic cigarettes. If you need help quitting, ask your health care provider.  Try to reduce your stress level, such as with yoga or meditation. Talk with your health care provider if you need help. General Instructions  Watch your dizziness for any changes.  Take medicines only as directed by your health care provider. Talk with your health care provider if you think that your dizziness is caused by a medicine that you are taking.  Tell a friend or a family member that you are feeling dizzy. If he or she notices any changes in your behavior, have this person call your health care provider.  Keep all follow-up visits as directed by your health care provider. This is important. SEEK MEDICAL CARE IF:  Your dizziness does not go away.  Your dizziness or light-headedness gets worse.  You feel nauseous.  You have reduced hearing.  You have new symptoms.  You are unsteady on your feet or you feel like the room is  spinning. SEEK IMMEDIATE MEDICAL CARE IF:  You vomit or have diarrhea and are unable to eat or drink anything.  You have problems talking, walking, swallowing, or using your arms, hands, or legs.  You feel generally weak.  You are not thinking clearly or you have trouble forming sentences. It may take a friend or family member to notice this.  You have chest pain, abdominal pain, shortness of breath, or sweating.  Your vision changes.  You notice any bleeding.  You have a headache.  You have neck pain or a stiff neck.  You have a fever.   This information is not intended to replace advice given to you by your health care provider. Make sure you discuss any questions you have with your health care  provider.   Document Released: 07/10/2000 Document Revised: 05/31/2014 Document Reviewed: 01/10/2014 Elsevier Interactive Patient Education [employer]

## 2015-04-20 NOTE — ED Provider Notes (Signed)
[facility] Emergency Department Provider Note   ____________________________________________  Time seen: Approximately 12:45 PM I have reviewed the triage vital signs and the triage nursing note.  HISTORY  Chief Complaint Dizziness   Historian Patient  HPI Sarah PlowmanMaria Tapia is a 60 y.o. female is here for complaint of dizziness described as room spinning. She woke up feeling like this this morning. She has had an episode like this in the past, when she was admitted to the hospital because of a passing out episode associated with the room spinning.  Today patient states symptoms are worse with movement change position. She did try Dramamine at home. No weakness or numbness. No confusion altered mental status. No upper respiratory congestion. No trouble breathing or chest pain. No palpitations. No passing out.  Symptoms are moderate.    Past Medical History  Diagnosis Date  . Hypertension   . Gastritis   . GERD (gastroesophageal reflux disease)     There are no active problems to display for this patient.   Past Surgical History  Procedure Laterality Date  . Abdominal hysterectomy      Current Outpatient Rx  Name  Route  Sig  Dispense  Refill  . meclizine (ANTIVERT) 25 MG tablet   Oral   Take 1 tablet (25 mg total) by mouth 3 (three) times daily as needed for dizziness or nausea.   20 tablet   0   . ondansetron (ZOFRAN) 4 MG tablet   Oral   Take 1 tablet (4 mg total) by mouth every 8 (eight) hours as needed for nausea or vomiting.   10 tablet   0     Allergies Penicillins  Family History  Problem Relation Age of Onset  . Breast cancer Sister [id number]    Social History Social History  Substance Use Topics  . Smoking status: None  . Smokeless tobacco: None  . Alcohol Use: None    Review of Systems  Constitutional: Negative for fever. Eyes: Negative for visual changes. ENT: Negative for sore throat. Cardiovascular: Negative for chest  pain. Respiratory: Negative for shortness of breath. Gastrointestinal: Negative for abdominal pain, vomiting and diarrhea. Genitourinary: Negative for dysuria. Musculoskeletal: Negative for back pain. Skin: Negative for rash. Neurological: Negative for headache. 10 point Review of Systems otherwise negative ____________________________________________   PHYSICAL EXAM:  VITAL SIGNS: ED Triage Vitals  Enc Vitals Group     BP --      Pulse --      Resp --      Temp --      Temp src --      SpO2 --      Weight 04/20/15 1217 158 lb (71.668 kg)     Height 04/20/15 1217 5\' 3"  (1.6 m)     Head Cir --      Peak Flow --      Pain Score 04/20/15 1217 10     Pain Loc --      Pain Edu? --      Excl. in GC? --      Constitutional: Alert and oriented. Well appearing and in no distress. HEENT   Head: Normocephalic and atraumatic.      Eyes: Conjunctivae are normal. PERRL. Normal extraocular movements.       Ears:         Nose: No congestion/rhinnorhea.   Mouth/Throat: Mucous membranes are moist.   Neck: No stridor. Cardiovascular/Chest: Normal rate, regular rhythm.  No murmurs, rubs, or gallops. Respiratory:  Normal respiratory effort without tachypnea nor retractions. Breath sounds are clear and equal bilaterally. No wheezes/rales/rhonchi. Gastrointestinal: Soft. No distention, no guarding, no rebound. Nontender.    Genitourinary/rectal:Deferred Musculoskeletal: Nontender with normal range of motion in all extremities. No joint effusions.  No lower extremity tenderness.  No edema. Neurologic:  Normal speech and language. Finger-nose intact. No gross or focal neurologic deficits are appreciated. Skin:  Skin is warm, dry and intact. No rash noted. Psychiatric: Mood and affect are normal. Speech and behavior are normal. Patient exhibits appropriate insight and judgment.  ____________________________________________   EKG I, Governor Rooks, MD, the attending physician have  personally viewed and interpreted all ECGs.  84 bpm. Normal sinus rhythm. Narrow QRS. Normal axis. Nonspecific ST-T wave ____________________________________________  LABS (pertinent positives/negatives)  White blood count 6.9, hemoglobin 13.3 Metabolic panel without significant abnormality Urinalysis negative  ____________________________________________  RADIOLOGY All Xrays were viewed by me. Imaging interpreted by Radiologist.  None __________________________________________  PROCEDURES  Procedure(s) performed: None  Critical Care performed: None  ____________________________________________   ED COURSE / ASSESSMENT AND PLAN  Pertinent labs & imaging results that were available during my care of the patient were reviewed by me and considered in my medical decision making (see chart for details).   Patient's symptoms today sound like peripheral vertigo, and she has a history of peripheral vertigo.  I looked up her hospitalization from 2013 when she was admitted to [facility] after vertigo associated with later about of syncope for a possible seizure workup. At that point time she did have a negative workup with negative MRI and EEG.  Patient's neurologic exam is intact today, and I don't feel suspicious of acute stroke or other intracranial emergency as a source of her room spinning sensation.  I am going to start initiate treatment for peripheral vertigo for symptoms.  Patient felt much better after meclizine, Zofran, and her home dose of Xanax.  I discussed with her in a high risk/red flag features for neuroimaging, and return for cautions to the ED.    CONSULTATIONS:   None   Patient / Family / Caregiver informed of clinical course, medical decision-making process, and agree with plan.   I discussed return precautions, follow-up instructions, and discharged instructions with patient and/or family.   ___________________________________________   FINAL  CLINICAL IMPRESSION(S) / ED DIAGNOSES   Final diagnoses:  Vertigo              Note: This dictation was prepared with Dragon dictation. Any transcriptional errors that result from this process are unintentional   Governor Rooks, MD 04/20/15 1453

## 2015-04-20 NOTE — ED Notes (Signed)
Per EMS: Dizziness that started yesterday, progressively worsening, headache as well, hypertensive with history.  EMS Stroke screen negative.   Other than hypertensive, vital signs stable.  At triage, equal grip strength, no drift or droop noted.

## 2017-04-11 ENCOUNTER — Emergency Department
Admission: EM | Admit: 2017-04-11 | Discharge: 2017-04-11 | Disposition: A | Discharge status: Home / Self Care | Payer: BLUE CROSS/BLUE SHIELD | Attending: Emergency Medicine | Admitting: Emergency Medicine

## 2017-04-11 ENCOUNTER — Encounter: Payer: Self-pay | Admitting: Emergency Medicine

## 2017-04-11 ENCOUNTER — Other Ambulatory Visit: Payer: Self-pay

## 2017-04-11 DIAGNOSIS — I10 Essential (primary) hypertension: Secondary | ICD-10-CM | POA: Diagnosis not present

## 2017-04-11 DIAGNOSIS — Z91040 Latex allergy status: Secondary | ICD-10-CM | POA: Insufficient documentation

## 2017-04-11 DIAGNOSIS — K529 Noninfective gastroenteritis and colitis, unspecified: Secondary | ICD-10-CM

## 2017-04-11 DIAGNOSIS — R112 Nausea with vomiting, unspecified: Secondary | ICD-10-CM | POA: Diagnosis present

## 2017-04-11 LAB — COMPREHENSIVE METABOLIC PANEL
ALK PHOS: 87 U/L (ref 38–126)
ALT: 17 U/L (ref 14–54)
ANION GAP: 9 (ref 5–15)
AST: 27 U/L (ref 15–41)
Albumin: 4 g/dL (ref 3.5–5.0)
BILIRUBIN TOTAL: 1.2 mg/dL (ref 0.3–1.2)
BUN: 7 mg/dL (ref 6–20)
CALCIUM: 9.3 mg/dL (ref 8.9–10.3)
CO2: 26 mmol/L (ref 22–32)
Chloride: 105 mmol/L (ref 101–111)
Creatinine, Ser: 0.74 mg/dL (ref 0.44–1.00)
GFR calc Af Amer: >60 mL/min (ref >60)
Glucose, Bld: 101 mg/dL — ABNORMAL HIGH (ref 65–99)
POTASSIUM: 3.7 mmol/L (ref 3.5–5.1)
Sodium: 140 mmol/L (ref 135–145)
TOTAL PROTEIN: 7.7 g/dL (ref 6.5–8.1)

## 2017-04-11 LAB — CBC WITH DIFFERENTIAL/PLATELET
BASOS ABS: 0 10*3/uL (ref 0–0.1)
BASOS PCT: 0 %
EOS ABS: 0 10*3/uL (ref 0–0.7)
EOS PCT: 0 %
HCT: 40.7 % (ref 35.0–47.0)
Hemoglobin: 13.3 g/dL (ref 12.0–16.0)
Lymphocytes Relative: 20 %
Lymphs Abs: 1.4 10*3/uL (ref 1.0–3.6)
MCH: 26.5 pg (ref 26.0–34.0)
MCHC: 32.7 g/dL (ref 32.0–36.0)
MCV: 80.9 fL (ref 80.0–100.0)
MONO ABS: 0.6 10*3/uL (ref 0.2–0.9)
Monocytes Relative: 8 %
NEUTROS ABS: 5.2 10*3/uL (ref 1.4–6.5)
Neutrophils Relative %: 72 %
PLATELETS: 252 10*3/uL (ref 150–440)
RBC: 5.03 MIL/uL (ref 3.80–5.20)
RDW: 14.1 % (ref 11.5–14.5)
WBC: 7.2 10*3/uL (ref 3.6–11.0)

## 2017-04-11 LAB — URINALYSIS, COMPLETE (UACMP) WITH MICROSCOPIC
BILIRUBIN URINE: NEGATIVE
Bacteria, UA: NONE SEEN
GLUCOSE, UA: NEGATIVE mg/dL
KETONES UR: NEGATIVE mg/dL
NITRITE: NEGATIVE
PH: 7 (ref 5.0–8.0)
Protein, ur: NEGATIVE mg/dL
RBC / HPF: NONE SEEN RBC/hpf (ref 0–5)
Specific Gravity, Urine: 1.001 — ABNORMAL LOW (ref 1.005–1.030)

## 2017-04-11 LAB — LIPASE, BLOOD: LIPASE: 38 U/L (ref 11–51)

## 2017-04-11 MED ORDER — SODIUM CHLORIDE 0.9 % IV BOLUS (SEPSIS)
1000.0000 mL | Freq: Once | INTRAVENOUS | Status: AC
  Administered 2017-04-11: 1000 mL via INTRAVENOUS

## 2017-04-11 MED ORDER — ONDANSETRON HCL 4 MG PO TABS: 4.0000 mg | ORAL_TABLET | Freq: Three times a day (TID) | ORAL | 0 refills | Status: DC | PRN

## 2017-04-11 MED ORDER — ONDANSETRON HCL 4 MG/2ML IJ SOLN
4.0000 mg | Freq: Once | INTRAMUSCULAR | Status: AC
  Administered 2017-04-11: 4 mg via INTRAVENOUS
  Filled 2017-04-11: qty 2

## 2017-04-11 NOTE — ED Triage Notes (Signed)
Pt states N,V,D for the past 3 days, unable to keep anything down, states she is feeling weak and occasionally dizzy.  Appears in NAD at this time.

## 2017-04-11 NOTE — ED Notes (Signed)
VORB PO challenge and ambulation to see how patient tolerates.

## 2017-04-11 NOTE — ED Notes (Signed)
Attempted to ambulate patient in hallway, patient states dizziness and needed to sit down.  Patient wheeled back to room, MD notified and VORB for bolus of normal saline started.

## 2017-04-11 NOTE — ED Provider Notes (Signed)
[facility] Emergency Department Provider Note  ____________________________________________   I have reviewed the triage vital signs and the nursing notes. Where available I have reviewed prior notes and, if possible and indicated, outside hospital notes.    HISTORY  Chief Complaint Emesis and Diarrhea    HPI Sarah Tapia is a 62 y.o. female who presents today complaining of nausea vomiting diarrhea for a few days.  She states yesterday was the worst.  She feels somewhat better today although she still vomited twice today.  No melena no bright red blood per rectum no recent antibiotics no recent travel no abdominal discomfort unless she is in the active vomiting or having diarrhea, in which case she has some cramping.  She denies any fever.  She does have positive sick contacts and there is very large to be in any burden of similar with many people in the emergency room with the exact same symptoms.  Patient states that she has no dysuria, she is holding p.o. down sometimes.  She denies any pain at this time.  No recent camping.  Has not tried anything prior to coming in for this aside from oral hydration, nothing else makes it better or worse    Past Medical History:  Diagnosis Date  . Gastritis   . GERD (gastroesophageal reflux disease)   . Hypertension     There are no active problems to display for this patient.   Past Surgical History:  Procedure Laterality Date  . ABDOMINAL HYSTERECTOMY      Prior to Admission medications   Medication Sig Start Date End Date Taking? Authorizing Provider  meclizine (ANTIVERT) 25 MG tablet Take 1 tablet (25 mg total) by mouth 3 (three) times daily as needed for dizziness or nausea. 04/20/15   Governor RooksLord, Rebecca, MD  ondansetron (ZOFRAN) 4 MG tablet Take 1 tablet (4 mg total) by mouth every 8 (eight) hours as needed for nausea or vomiting. 04/20/15   Governor RooksLord, Rebecca, MD    Allergies Latex; Penicillins; and Ivp dye [iodinated  diagnostic agents]  Family History  Problem Relation Age of Onset  . Breast cancer Sister [id number]    Social History Social History   Tobacco Use  . Smoking status: Never Smoker  . Smokeless tobacco: Never Used  Substance Use Topics  . Alcohol use: No    Frequency: Never  . Drug use: No    Review of Systems Constitutional: No fever/chills Eyes: No visual changes. ENT: No sore throat. No stiff neck no neck pain Cardiovascular: Denies chest pain. Respiratory: Denies shortness of breath. Gastrointestinal:   See HPI genitourinary: Negative for dysuria. Musculoskeletal: Negative lower extremity swelling Skin: Negative for rash. Neurological: Negative for severe headaches, focal weakness or numbness.   ____________________________________________   PHYSICAL EXAM:  VITAL SIGNS: ED Triage Vitals  Enc Vitals Group     BP 04/11/17 0942 (!) 165/101     Pulse Rate 04/11/17 0942 75     Resp 04/11/17 0942 18     Temp 04/11/17 0942 98.2 F (36.8 C)     Temp Source 04/11/17 0942 Oral     SpO2 04/11/17 0942 97 %     Weight 04/11/17 0943 152 lb (68.9 kg)     Height 04/11/17 0943 5\' 3"  (1.6 m)     Head Circumference --      Peak Flow --      Pain Score 04/11/17 0942 7     Pain Loc --      Pain  Edu? --      Excl. in GC? --     Constitutional: Alert and oriented. Well appearing and in no acute distress.  Patient is mildly anxious Eyes: Conjunctivae are normal Head: Atraumatic HEENT: No congestion/rhinnorhea. Mucous membranes are slightly dry.  Oropharynx non-erythematous Neck:   Nontender with no meningismus, no masses, no stridor Cardiovascular: Normal rate, regular rhythm. Grossly normal heart sounds.  Good peripheral circulation. Respiratory: Normal respiratory effort.  No retractions. Lungs CTAB. Abdominal: Soft and nontender. No distention. No guarding no rebound Back:  There is no focal tenderness or step off.  there is no midline tenderness there are no lesions noted.  there is no CVA tenderness  Musculoskeletal: No lower extremity tenderness, no upper extremity tenderness. No joint effusions, no DVT signs strong distal pulses no edema Neurologic:  Normal speech and language. No gross focal neurologic deficits are appreciated.  Skin:  Skin is warm, dry and intact. No rash noted. Psychiatric: Mood and affect are normal. Speech and behavior are normal.  ____________________________________________   LABS (all labs ordered are listed, but only abnormal results are displayed)  Labs Reviewed  CBC WITH DIFFERENTIAL/PLATELET  URINALYSIS, COMPLETE (UACMP) WITH MICROSCOPIC  COMPREHENSIVE METABOLIC PANEL  LIPASE, BLOOD    Pertinent labs  results that were available during my care of the patient were reviewed by me and considered in my medical decision making (see chart for details). ____________________________________________  EKG  I personally interpreted any EKGs ordered by me or triage  ____________________________________________  RADIOLOGY  Pertinent labs & imaging results that were available during my care of the patient were reviewed by me and considered in my medical decision making (see chart for details). If possible, patient and/or family made aware of any abnormal findings.  No results found. ____________________________________________    PROCEDURES  Procedure(s) performed: None  Procedures  Critical Care performed: None  ____________________________________________   INITIAL IMPRESSION / ASSESSMENT AND PLAN / ED COURSE  Pertinent labs & imaging results that were available during my care of the patient were reviewed by me and considered in my medical decision making (see chart for details).  Patient here with nausea vomiting and diarrhea for a few days, very high community burden of similar, patient is somewhat anxious, abdomen however is nonsurgical with no focal abdominal tenderness noted.  We will give her IV hydration  antiemetics check blood work and reassess.  Most likely viral gastroenteritis consistent with multiple other patients and with all of her symptoms and signs    ____________________________________________   FINAL CLINICAL IMPRESSION(S) / ED DIAGNOSES  Final diagnoses:  None      This chart was dictated using voice recognition software.  Despite best efforts to proofread,  errors can occur which can change meaning.      Jeanmarie Plant, MD 04/11/17 1002

## 2017-04-11 NOTE — ED Notes (Signed)
ED Provider at bedside. 

## 2017-07-15 ENCOUNTER — Other Ambulatory Visit: Payer: Self-pay | Admitting: Family Medicine

## 2017-07-15 DIAGNOSIS — Z1231 Encounter for screening mammogram for malignant neoplasm of breast: Secondary | ICD-10-CM

## 2017-08-13 ENCOUNTER — Ambulatory Visit
Admission: RE | Admit: 2017-08-13 | Discharge: 2017-08-13 | Disposition: A | Discharge status: Home / Self Care | Payer: BLUE CROSS/BLUE SHIELD | Source: Ambulatory Visit | Attending: Family Medicine | Admitting: Family Medicine

## 2017-08-13 DIAGNOSIS — Z1231 Encounter for screening mammogram for malignant neoplasm of breast: Secondary | ICD-10-CM | POA: Diagnosis not present

## 2017-08-15 ENCOUNTER — Other Ambulatory Visit: Payer: Self-pay | Admitting: Family Medicine

## 2017-08-15 DIAGNOSIS — N6310 Unspecified lump in the right breast, unspecified quadrant: Secondary | ICD-10-CM

## 2017-08-15 DIAGNOSIS — R928 Other abnormal and inconclusive findings on diagnostic imaging of breast: Secondary | ICD-10-CM

## 2017-08-26 ENCOUNTER — Ambulatory Visit
Admission: RE | Admit: 2017-08-26 | Discharge: 2017-08-26 | Disposition: A | Discharge status: Home / Self Care | Payer: BLUE CROSS/BLUE SHIELD | Source: Ambulatory Visit | Attending: Family Medicine | Admitting: Family Medicine

## 2017-08-26 DIAGNOSIS — R928 Other abnormal and inconclusive findings on diagnostic imaging of breast: Secondary | ICD-10-CM | POA: Diagnosis present

## 2017-08-26 DIAGNOSIS — N6310 Unspecified lump in the right breast, unspecified quadrant: Secondary | ICD-10-CM | POA: Insufficient documentation

## 2020-04-10 ENCOUNTER — Other Ambulatory Visit: Payer: Self-pay | Admitting: Family Medicine

## 2020-04-10 DIAGNOSIS — Z1231 Encounter for screening mammogram for malignant neoplasm of breast: Secondary | ICD-10-CM

## 2020-05-16 ENCOUNTER — Ambulatory Visit
Admission: RE | Admit: 2020-05-16 | Discharge: 2020-05-16 | Disposition: A | Discharge status: Home / Self Care | Payer: Commercial Managed Care - HMO | Source: Ambulatory Visit | Attending: Family Medicine | Admitting: Family Medicine

## 2020-05-16 ENCOUNTER — Other Ambulatory Visit: Payer: Self-pay

## 2020-05-16 DIAGNOSIS — Z1231 Encounter for screening mammogram for malignant neoplasm of breast: Secondary | ICD-10-CM | POA: Diagnosis not present

## 2020-08-16 ENCOUNTER — Other Ambulatory Visit: Payer: Self-pay | Admitting: Gastroenterology

## 2020-08-16 DIAGNOSIS — R1012 Left upper quadrant pain: Secondary | ICD-10-CM

## 2020-09-01 ENCOUNTER — Ambulatory Visit: Admission: RE | Admit: 2020-09-01 | Payer: Commercial Managed Care - HMO | Source: Ambulatory Visit

## 2020-09-01 ENCOUNTER — Ambulatory Visit: Payer: Commercial Managed Care - HMO

## 2021-02-07 DIAGNOSIS — R9431 Abnormal electrocardiogram [ECG] [EKG]: Secondary | ICD-10-CM | POA: Insufficient documentation

## 2021-02-07 DIAGNOSIS — E782 Mixed hyperlipidemia: Secondary | ICD-10-CM | POA: Insufficient documentation

## 2021-03-12 DIAGNOSIS — R002 Palpitations: Secondary | ICD-10-CM | POA: Insufficient documentation

## 2021-03-12 DIAGNOSIS — R079 Chest pain, unspecified: Secondary | ICD-10-CM | POA: Insufficient documentation

## 2021-10-16 ENCOUNTER — Other Ambulatory Visit: Payer: Self-pay | Admitting: Family Medicine

## 2021-10-16 DIAGNOSIS — Z1231 Encounter for screening mammogram for malignant neoplasm of breast: Secondary | ICD-10-CM

## 2021-11-09 ENCOUNTER — Ambulatory Visit
Admission: RE | Admit: 2021-11-09 | Discharge: 2021-11-09 | Disposition: A | Discharge status: Home / Self Care | Payer: Commercial Managed Care - HMO | Source: Ambulatory Visit | Attending: Family Medicine | Admitting: Family Medicine

## 2021-11-09 DIAGNOSIS — Z1231 Encounter for screening mammogram for malignant neoplasm of breast: Secondary | ICD-10-CM | POA: Diagnosis not present

## 2022-02-01 ENCOUNTER — Other Ambulatory Visit: Payer: Self-pay

## 2022-02-01 ENCOUNTER — Emergency Department
Admission: EM | Admit: 2022-02-01 | Discharge: 2022-02-01 | Disposition: A | Discharge status: Home / Self Care | Payer: Commercial Managed Care - HMO | Attending: Emergency Medicine | Admitting: Emergency Medicine

## 2022-02-01 ENCOUNTER — Emergency Department: Payer: Commercial Managed Care - HMO

## 2022-02-01 DIAGNOSIS — I471 Supraventricular tachycardia, unspecified: Secondary | ICD-10-CM | POA: Diagnosis not present

## 2022-02-01 DIAGNOSIS — E876 Hypokalemia: Secondary | ICD-10-CM

## 2022-02-01 DIAGNOSIS — I10 Essential (primary) hypertension: Secondary | ICD-10-CM | POA: Diagnosis not present

## 2022-02-01 DIAGNOSIS — R000 Tachycardia, unspecified: Secondary | ICD-10-CM | POA: Diagnosis present

## 2022-02-01 LAB — HEPATIC FUNCTION PANEL
ALT: 27 U/L (ref 0–44)
AST: 39 U/L (ref 15–41)
Albumin: 2.8 g/dL — ABNORMAL LOW (ref 3.5–5.0)
Alkaline Phosphatase: 46 U/L (ref 38–126)
Bilirubin, Direct: 0.3 mg/dL — ABNORMAL HIGH (ref 0.0–0.2)
Indirect Bilirubin: 0.8 mg/dL (ref 0.3–0.9)
Total Bilirubin: 1.1 mg/dL (ref 0.3–1.2)
Total Protein: 5.6 g/dL — ABNORMAL LOW (ref 6.5–8.1)

## 2022-02-01 LAB — BASIC METABOLIC PANEL
Anion gap: 7 (ref 5–15)
Anion gap: 9 (ref 5–15)
BUN: <5 mg/dL — ABNORMAL LOW (ref 8–23)
BUN: 5 mg/dL — ABNORMAL LOW (ref 8–23)
CO2: 17 mmol/L — ABNORMAL LOW (ref 22–32)
CO2: 25 mmol/L (ref 22–32)
Calcium: 6.2 mg/dL — CL (ref 8.9–10.3)
Calcium: 8.7 mg/dL — ABNORMAL LOW (ref 8.9–10.3)
Chloride: 115 mmol/L — ABNORMAL HIGH (ref 98–111)
Chloride: 105 mmol/L (ref 98–111)
Creatinine, Ser: 0.52 mg/dL (ref 0.44–1.00)
Creatinine, Ser: 0.68 mg/dL (ref 0.44–1.00)
GFR, Estimated: >60 mL/min (ref >60)
GFR, Estimated: >60 mL/min (ref >60)
Glucose, Bld: 105 mg/dL — ABNORMAL HIGH (ref 70–99)
Glucose, Bld: 110 mg/dL — ABNORMAL HIGH (ref 70–99)
Potassium: 2.6 mmol/L — CL (ref 3.5–5.1)
Potassium: 3.7 mmol/L (ref 3.5–5.1)
Sodium: 139 mmol/L (ref 135–145)
Sodium: 139 mmol/L (ref 135–145)

## 2022-02-01 LAB — T4, FREE: Free T4: 1.2 ng/dL — ABNORMAL HIGH (ref 0.61–1.12)

## 2022-02-01 LAB — CBC
HCT: 42.6 % (ref 36.0–46.0)
Hemoglobin: 13.1 g/dL (ref 12.0–15.0)
MCH: 26.2 pg (ref 26.0–34.0)
MCHC: 30.8 g/dL (ref 30.0–36.0)
MCV: 85.2 fL (ref 80.0–100.0)
Platelets: 212 10*3/uL (ref 150–400)
RBC: 5 MIL/uL (ref 3.87–5.11)
RDW: 13.3 % (ref 11.5–15.5)
WBC: 7.5 10*3/uL (ref 4.0–10.5)
nRBC: 0 % (ref 0.0–0.2)

## 2022-02-01 LAB — TROPONIN I (HIGH SENSITIVITY)
Troponin I (High Sensitivity): 6 ng/L (ref <18)
Troponin I (High Sensitivity): 25 ng/L — ABNORMAL HIGH (ref <18)

## 2022-02-01 LAB — PHOSPHORUS: Phosphorus: 1.9 mg/dL — ABNORMAL LOW (ref 2.5–4.6)

## 2022-02-01 LAB — MAGNESIUM: Magnesium: 1.6 mg/dL — ABNORMAL LOW (ref 1.7–2.4)

## 2022-02-01 LAB — TSH: TSH: 1.071 u[IU]/mL (ref 0.350–4.500)

## 2022-02-01 MED ORDER — POTASSIUM CHLORIDE CRYS ER 20 MEQ PO TBCR: 20.0000 meq | EXTENDED_RELEASE_TABLET | Freq: Two times a day (BID) | ORAL | 0 refills | Status: DC

## 2022-02-01 MED ORDER — MAGNESIUM OXIDE -MG SUPPLEMENT 400 (240 MG) MG PO TABS
400.0000 mg | ORAL_TABLET | Freq: Once | ORAL | Status: AC
  Administered 2022-02-01: 400 mg via ORAL
  Filled 2022-02-01: qty 1

## 2022-02-01 MED ORDER — OYSTER SHELL CALCIUM/D3 500-5 MG-MCG PO TABS: 1.0000 | ORAL_TABLET | Freq: Every day | ORAL | 0 refills | Status: DC

## 2022-02-01 MED ORDER — CALCIUM GLUCONATE-NACL 2-0.675 GM/100ML-% IV SOLN
2.0000 g | Freq: Once | INTRAVENOUS | Status: AC
  Administered 2022-02-01: 2000 mg via INTRAVENOUS
  Filled 2022-02-01: qty 100

## 2022-02-01 MED ORDER — POTASSIUM CHLORIDE CRYS ER 20 MEQ PO TBCR
40.0000 meq | EXTENDED_RELEASE_TABLET | Freq: Once | ORAL | Status: AC
  Administered 2022-02-01: 40 meq via ORAL
  Filled 2022-02-01: qty 2

## 2022-02-01 MED ORDER — POTASSIUM CHLORIDE 10 MEQ/100ML IV SOLN
10.0000 meq | INTRAVENOUS | Status: AC
  Administered 2022-02-01 (×2): 10 meq via INTRAVENOUS
  Filled 2022-02-01: qty 100

## 2022-02-01 NOTE — ED Provider Notes (Signed)
[facility] Provider Note    Event Date/Time   First MD Initiated Contact with Patient 02/01/22 1544     (approximate)   History   Tachycardia   HPI Sarah Tapia is a 67 y.o. female with past medical history of hypertension, GERD who presents after an episode of SVT.  Patient tells me that she was in her normal state of health when she started having palpitations and felt quite lightheaded.  Patient's husband does home infusions and the infusion nurse happened to be there checked her heart rate said it was quite high and called the ambulance.  Patient says she syncopized and there is a period of time where she does not remember between being at her house and being in the ambulance.  Per EMS she had a heart rate of 240 and received 6 of adenosine and then converted back to sinus rhythm.   Patient denies known history of SVT.  She has had palpitations in the past.  Denies any recent diarrhea or change in p.o. intake.  No history of electrolyte abnormalities.   Past Medical History:  Diagnosis Date   Gastritis    GERD (gastroesophageal reflux disease)    Hypertension     There are no problems to display for this patient.    Physical Exam  Triage Vital Signs: ED Triage Vitals [02/01/22 1449]  Enc Vitals Group     BP (!) 133/104     Pulse Rate (!) 106     Resp 17     Temp 98.4 F (36.9 C)     Temp Source Oral     SpO2 97 %     Weight      Height      Head Circumference      Peak Flow      Pain Score 0     Pain Loc      Pain Edu?      Excl. in [place]?     Most recent vital signs: Vitals:   02/01/22 1708 02/01/22 1918  BP: (!) 110/95 (!) 121/53  Pulse: 78 60  Resp: 16 16  Temp:  97.7 F (36.5 C)  SpO2: 99%      General: Awake, no distress.  CV:  Good peripheral perfusion.  No emeda, RRR Resp:  Normal effort.  Abd:  No distention.  Neuro:             Awake, Alert, Oriented x 3  Other:     ED Results / Procedures / Treatments   Labs (all labs ordered are listed, but only abnormal results are displayed) Labs Reviewed  BASIC METABOLIC PANEL - Abnormal; Notable for the following components:      Result Value   Potassium 2.6 (*)    Chloride 115 (*)    CO2 17 (*)    Glucose, Bld 105 (*)    BUN <5 (*)    Calcium 6.2 (*)    All other components within normal limits  HEPATIC FUNCTION PANEL - Abnormal; Notable for the following components:   Total Protein 5.6 (*)    Albumin 2.8 (*)    Bilirubin, Direct 0.3 (*)    All other components within normal limits  MAGNESIUM - Abnormal; Notable for the following components:   Magnesium 1.6 (*)    All other components within normal limits  PHOSPHORUS - Abnormal; Notable for the following components:   Phosphorus 1.9 (*)    All other components within normal limits  T4, FREE - Abnormal; Notable for the following components:   Free T4 1.20 (*)    All other components within normal limits  BASIC METABOLIC PANEL - Abnormal; Notable for the following components:   Glucose, Bld 110 (*)    BUN 5 (*)    Calcium 8.7 (*)    All other components within normal limits  TROPONIN I (HIGH SENSITIVITY) - Abnormal; Notable for the following components:   Troponin I (High Sensitivity) 25 (*)    All other components within normal limits  CBC  TSH  CALCIUM, IONIZED  PARATHYROID HORMONE, INTACT (NO CA)  25-HYDROXY VITAMIN D LCMS D2+D3  TROPONIN I (HIGH SENSITIVITY)     EKG  EKG interpretation performed by myself: NSR, nml axis, nml intervals, no acute ischemic changes    RADIOLOGY I reviewed and interpreted the CXR which does not show any acute cardiopulmonary process    PROCEDURES:  Critical Care performed: Yes, see critical care procedure note(s)  .Critical Care  Performed by: Rada Hay, MD Authorized by: Rada Hay, MD   Critical care provider statement:    Critical care time (minutes):  30   Critical care was time spent personally by me on the  following activities:  Development of treatment plan with patient or surrogate, discussions with consultants, evaluation of patient's response to treatment, examination of patient, ordering and review of laboratory studies, ordering and review of radiographic studies, ordering and performing treatments and interventions, pulse oximetry, re-evaluation of patient's condition and review of old charts   The patient is on the cardiac monitor to evaluate for evidence of arrhythmia and/or significant heart rate changes.   MEDICATIONS ORDERED IN ED: Medications  potassium chloride SA (KLOR-CON M) CR tablet 40 mEq (40 mEq Oral Given 02/01/22 1622)  potassium chloride 10 mEq in 100 mL IVPB (0 mEq Intravenous Stopped 02/01/22 1936)  calcium gluconate 2 g/ 100 mL sodium chloride IVPB (0 mg Intravenous Stopped 02/01/22 1805)  magnesium oxide (MAG-OX) tablet 400 mg (400 mg Oral Given 02/01/22 1651)     IMPRESSION / MDM / ASSESSMENT AND PLAN / ED COURSE  I reviewed the triage vital signs and the nursing notes.                              Patient's presentation is most consistent with acute presentation with potential threat to life or bodily function.  Differential diagnosis includes, but is not limited to, AVNRT, AVRT, electrode abnormality, structural heart disease  Patient is a 67 year old female who presents after an episode of palpitations.  Came on rather acutely and was associated with lightheadedness and syncope.  I was not present for EMS report but per triage note she had a heart rate of 240 and it looked like SVT and she was given adenosine and converted to sinus rhythm.  Patient is feeling fine now just feels somewhat fatigued.  She is in normal sinus rhythm.   EKG was reassuring intervals.  Labs are notable for significant hypocalcemia and hypokalemia with potassium 2.6 and calcium 6.2.  Patient has no history or symptoms suggest GI losses looked back at labs from last year and has no history of  similar.  I have added on a magnesium phosphorus will send iCal PTH vitamin D levels as well.   Will give IV and p.o. potassium and 2 g of calcium gluconate.  Patient's mag and Phos are also both low.  Given p.o.  mag.  Free T4 is mildly increased.  iCal PTH and vitamin D level still pending.  After supplementation repeat BMP showing improved calcium potassium and normalized bicarb.  Patient's troponin did go from 6-25 however I would expect this with high-sensitivity troponin in the setting of an SVT with a heart rate of 240 and there is going dated that checking troponins with SVT leads to unnecessary cardiac workups.  Patient did not have ischemic pain during the episode does not have ischemic pain since and has no cardiac risk factors so do not feel that she needs further workup for this at this time.  Explained the troponin bump to the patient and discussed return to ED if she develops any chest pain.  I did secure chat message Dr. Orland Mustard with nephrology regarding patient's labs to see if he would be the appropriate person for her to follow-up with.  Recommended PCP follow-up and then either follow-up with nephrology or endocrinology.  He recommends having patient take additional dose of potassium and calcium at home and then have PCP repeat the labs.  FINAL CLINICAL IMPRESSION(S) / ED DIAGNOSES   Final diagnoses:  SVT (supraventricular tachycardia)  Hypokalemia  Hypocalcemia  Hypophosphatemia  Hypomagnesemia     Rx / DC Orders   ED Discharge Orders          Ordered    potassium chloride SA (KLOR-CON M) 20 MEQ tablet  2 times daily        02/01/22 2048    calcium-vitamin D (OSCAL WITH D) 500-5 MG-MCG tablet  Daily with breakfast        02/01/22 2051             Note:  This document was prepared using Dragon voice recognition software and may include unintentional dictation errors.   Rada Hay, MD 02/01/22 2052

## 2022-02-01 NOTE — ED Notes (Signed)
Dr. Starleen Blue informed of pts critical potassium and calcium level

## 2022-02-01 NOTE — Discharge Instructions (Addendum)
You had a rhythm called supraventricular tachycardia.  You should follow-up with cardiology.  If you develop chest pain please return to the emergency department.  If you have recurrent episode of palpitations that does not go away with coughing or bearing down please call 911 and return to the emergency department.  Found several of your electrolytes to be abnormal including low potassium calcium phosphate and magnesium.  Please take 1 additional dose of potassium at home tomorrow and start taking the calcium supplement. Please follow-up with your primary care doctor in about 3 to 5 days to have your blood work repeated.  You should follow-up with nephrology regarding your electrolyte abnormalities.

## 2022-02-01 NOTE — ED Triage Notes (Signed)
Pt states she was feeling a fast heart rate with dizziness and had a syncopal episode. EMS reports that pt was in SVT and pt got a total of 18mg  of adenosine. EMS reports her heart rate was 244 and converted to 103.

## 2022-02-01 NOTE — ED Notes (Signed)
IV's d/c'd, vitals stable. Pt brought to car via wheel chair by assisting RN, Lattie Haw.

## 2022-02-02 LAB — PARATHYROID HORMONE, INTACT (NO CA): PTH: 34 pg/mL (ref 15–65)

## 2022-02-03 LAB — CALCIUM, IONIZED: Calcium, Ionized, Serum: 4.8 mg/dL (ref 4.5–5.6)

## 2022-02-06 LAB — 25-HYDROXY VITAMIN D LCMS D2+D3
25-Hydroxy, Vitamin D-2: 3.4 ng/mL
25-Hydroxy, Vitamin D-3: 27 ng/mL
25-Hydroxy, Vitamin D: 30 ng/mL

## 2022-05-17 ENCOUNTER — Emergency Department
Admission: EM | Admit: 2022-05-17 | Discharge: 2022-05-17 | Disposition: A | Discharge status: Home / Self Care | Payer: Commercial Managed Care - HMO | Attending: Emergency Medicine | Admitting: Emergency Medicine

## 2022-05-17 ENCOUNTER — Other Ambulatory Visit: Payer: Self-pay

## 2022-05-17 DIAGNOSIS — R059 Cough, unspecified: Secondary | ICD-10-CM | POA: Diagnosis present

## 2022-05-17 DIAGNOSIS — R112 Nausea with vomiting, unspecified: Secondary | ICD-10-CM

## 2022-05-17 DIAGNOSIS — U071 COVID-19: Secondary | ICD-10-CM | POA: Insufficient documentation

## 2022-05-17 DIAGNOSIS — I10 Essential (primary) hypertension: Secondary | ICD-10-CM | POA: Diagnosis not present

## 2022-05-17 DIAGNOSIS — R531 Weakness: Secondary | ICD-10-CM | POA: Diagnosis not present

## 2022-05-17 LAB — BASIC METABOLIC PANEL
Anion gap: 10 (ref 5–15)
BUN: 5 mg/dL — ABNORMAL LOW (ref 8–23)
CO2: 25 mmol/L (ref 22–32)
Calcium: 8.4 mg/dL — ABNORMAL LOW (ref 8.9–10.3)
Chloride: 100 mmol/L (ref 98–111)
Creatinine, Ser: 0.73 mg/dL (ref 0.44–1.00)
GFR, Estimated: >60 mL/min (ref >60)
Glucose, Bld: 131 mg/dL — ABNORMAL HIGH (ref 70–99)
Potassium: 3.2 mmol/L — ABNORMAL LOW (ref 3.5–5.1)
Sodium: 135 mmol/L (ref 135–145)

## 2022-05-17 LAB — URINALYSIS, ROUTINE W REFLEX MICROSCOPIC
Bilirubin Urine: NEGATIVE
Glucose, UA: NEGATIVE mg/dL
Hgb urine dipstick: NEGATIVE
Ketones, ur: NEGATIVE mg/dL
Leukocytes,Ua: NEGATIVE
Nitrite: NEGATIVE
Protein, ur: NEGATIVE mg/dL
Specific Gravity, Urine: 1.001 — ABNORMAL LOW (ref 1.005–1.030)
pH: 7 (ref 5.0–8.0)

## 2022-05-17 LAB — CBC
HCT: 42.1 % (ref 36.0–46.0)
Hemoglobin: 13.1 g/dL (ref 12.0–15.0)
MCH: 26.1 pg (ref 26.0–34.0)
MCHC: 31.1 g/dL (ref 30.0–36.0)
MCV: 84 fL (ref 80.0–100.0)
Platelets: 224 10*3/uL (ref 150–400)
RBC: 5.01 MIL/uL (ref 3.87–5.11)
RDW: 13.2 % (ref 11.5–15.5)
WBC: 4.2 10*3/uL (ref 4.0–10.5)
nRBC: 0 % (ref 0.0–0.2)

## 2022-05-17 LAB — HEPATIC FUNCTION PANEL
ALT: 35 U/L (ref 0–44)
AST: 40 U/L (ref 15–41)
Albumin: 4.1 g/dL (ref 3.5–5.0)
Alkaline Phosphatase: 60 U/L (ref 38–126)
Bilirubin, Direct: 0.1 mg/dL (ref 0.0–0.2)
Indirect Bilirubin: 0.9 mg/dL (ref 0.3–0.9)
Total Bilirubin: 1 mg/dL (ref 0.3–1.2)
Total Protein: 7.7 g/dL (ref 6.5–8.1)

## 2022-05-17 LAB — MAGNESIUM: Magnesium: 1.9 mg/dL (ref 1.7–2.4)

## 2022-05-17 LAB — LIPASE, BLOOD: Lipase: 38 U/L (ref 11–51)

## 2022-05-17 MED ORDER — ALPRAZOLAM 0.5 MG PO TABS
0.5000 mg | ORAL_TABLET | Freq: Once | ORAL | Status: AC
  Administered 2022-05-17: 0.5 mg via ORAL
  Filled 2022-05-17: qty 1

## 2022-05-17 MED ORDER — POTASSIUM CHLORIDE CRYS ER 20 MEQ PO TBCR
40.0000 meq | EXTENDED_RELEASE_TABLET | Freq: Once | ORAL | Status: AC
  Administered 2022-05-17: 40 meq via ORAL
  Filled 2022-05-17: qty 2

## 2022-05-17 MED ORDER — ONDANSETRON 4 MG PO TBDP: 4.0000 mg | ORAL_TABLET | Freq: Three times a day (TID) | ORAL | 0 refills | Status: AC | PRN

## 2022-05-17 MED ORDER — LACTATED RINGERS IV BOLUS
1000.0000 mL | Freq: Once | INTRAVENOUS | Status: AC
  Administered 2022-05-17: 1000 mL via INTRAVENOUS

## 2022-05-17 MED ORDER — ONDANSETRON HCL 4 MG/2ML IJ SOLN
4.0000 mg | Freq: Once | INTRAMUSCULAR | Status: AC
  Administered 2022-05-17: 4 mg via INTRAVENOUS
  Filled 2022-05-17: qty 2

## 2022-05-17 NOTE — ED Provider Notes (Signed)
[facility] Provider Note    Event Date/Time   First MD Initiated Contact with Patient 05/17/22 1111     (approximate)   History   Chief Complaint Weakness   HPI  Julius Boniface is a 67 y.o. female with past medical history of hypertension and GERD who presents to the ED complaining of generalized weakness and nausea.  Patient reports that she was initially diagnosed with COVID-19 1 week ago, was initially dealing with a cough but states this has resolved.  While her respiratory symptoms have improved, she continues to feel nauseous with multiple episodes of vomiting and diarrhea.  She denies any associated abdominal pain, but states she has been feeling weak in general.  She denies any fevers, chest pain, shortness of breath, or dysuria.  She has not been taking anything for her symptoms at home.     Physical Exam   Triage Vital Signs: ED Triage Vitals [05/17/22 1101]  Enc Vitals Group     BP (!) 151/102     Pulse Rate 86     Resp 18     Temp 98 F (36.7 C)     Temp Source Oral     SpO2 99 %     Weight      Height      Head Circumference      Peak Flow      Pain Score 4     Pain Loc      Pain Edu?      Excl. in [place]?     Most recent vital signs: Vitals:   05/17/22 1230 05/17/22 1400  BP: (!) 149/93 (!) 160/85  Pulse: 73 71  Resp:    Temp:    SpO2: 98% 100%    Constitutional: Alert and oriented. Eyes: Conjunctivae are normal. Head: Atraumatic. Nose: No congestion/rhinnorhea. Mouth/Throat: Mucous membranes are dry. Cardiovascular: Normal rate, regular rhythm. Grossly normal heart sounds.  2+ radial pulses bilaterally. Respiratory: Normal respiratory effort.  No retractions. Lungs CTAB. Gastrointestinal: Soft and nontender. No distention. Musculoskeletal: No lower extremity tenderness nor edema.  Neurologic:  Normal speech and language. No gross focal neurologic deficits are appreciated.    ED Results / Procedures / Treatments    Labs (all labs ordered are listed, but only abnormal results are displayed) Labs Reviewed  BASIC METABOLIC PANEL - Abnormal; Notable for the following components:      Result Value   Potassium 3.2 (*)    Glucose, Bld 131 (*)    BUN 5 (*)    Calcium 8.4 (*)    All other components within normal limits  URINALYSIS, ROUTINE W REFLEX MICROSCOPIC - Abnormal; Notable for the following components:   Color, Urine COLORLESS (*)    APPearance CLEAR (*)    Specific Gravity, Urine 1.001 (*)    All other components within normal limits  CBC  MAGNESIUM  HEPATIC FUNCTION PANEL  LIPASE, BLOOD  CBG MONITORING, ED     EKG  ED ECG REPORT I, Chesley Noon, the attending physician, personally viewed and interpreted this ECG.   Date: 05/17/2022  EKG Time: 11:07  Rate: 80  Rhythm: normal sinus rhythm  Axis: Normal  Intervals: Prolonged QT  ST&T Change: None  PROCEDURES:  Critical Care performed: No  Procedures   MEDICATIONS ORDERED IN ED: Medications  potassium chloride SA (KLOR-CON M) CR tablet 40 mEq (has no administration in time range)  lactated ringers bolus 1,000 mL (1,000 mLs Intravenous New Bag/Given 05/17/22 1220)  ondansetron [facility]) injection 4 mg (4 mg Intravenous Given 05/17/22 1220)  ALPRAZolam (XANAX) tablet 0.5 mg (0.5 mg Oral Given 05/17/22 1409)     IMPRESSION / MDM / ASSESSMENT AND PLAN / ED COURSE  I reviewed the triage vital signs and the nursing notes.                              67 y.o. female with past medical history of hypertension and GERD presents to the ED with ongoing generalized weakness, nausea, vomiting, and diarrhea following recent diagnosis of COVID-19.  Patient's presentation is most consistent with acute presentation with potential threat to life or bodily function.  Differential diagnosis includes, but is not limited to, COVID-19, pneumonia, dehydration, electrolyte abnormality, AKI, hepatitis, pancreatitis, UTI.  Patient  well-appearing and in no acute distress, vital signs are unremarkable.  She denies any respiratory symptoms and has a benign abdominal exam.  EKG shows borderline prolonged QT but is otherwise reassuring.  She does have mild hypokalemia and we will replete this, also check magnesium levels.  No significant anemia or leukocytosis noted, doubt developing pneumonia at this time.  Plan to hydrate with IV fluids and treat symptomatically with IV Zofran.  Additional labs are unremarkable, LFTs and lipase are within normal limits, urinalysis shows no signs of infection.  Patient tolerating oral intake without difficulty and is appropriate for discharge home with outpatient follow-up.  We will prescribe Zofran for use as needed, she was counseled to return to the ED for new or worsening symptoms.  Patient agrees with plan.      FINAL CLINICAL IMPRESSION(S) / ED DIAGNOSES   Final diagnoses:  COVID-19  Generalized weakness  Nausea and vomiting, unspecified vomiting type     Rx / DC Orders   ED Discharge Orders          Ordered    ondansetron (ZOFRAN-ODT) 4 MG disintegrating tablet  Every 8 hours PRN        05/17/22 1454             Note:  This document was prepared using Dragon voice recognition software and may include unintentional dictation errors.   Chesley Noon, MD 05/17/22 1455

## 2022-05-17 NOTE — ED Notes (Signed)
Patient c/o anxiety and requested her home dose of Xanax. Dr. Larinda Buttery is aware.

## 2022-05-17 NOTE — ED Notes (Signed)
Patient states nausea has improved. Patient is ambulating in the hallway to the bathroom with family member at [place].

## 2022-05-17 NOTE — ED Triage Notes (Signed)
Pt to ED via [facility] from home. Pt reports dx with COVID 1wk ago and has been feeling more weak since. Pt reports abdominal pain, dizziness and fatigue.

## 2022-05-17 NOTE — ED Notes (Signed)
Patient ambulated to and from hallway bathroom with a steady gait. 

## 2022-05-17 NOTE — ED Notes (Addendum)
First Nurse Note: Pt to ED via [facility] from home. Pt was diagnosed with COVID 1 week ago. Pt was given antiviral medication but she was not able to afford it so she did not pick it up. Pt developed N/V/D and persistent cough that started 3 days ago. Pt is also c/o Increased weakness as well.   BP: 161/108 SpO2: 99% HR: 96 ECO2 35  Temp: 98.1 CBG: 121

## 2022-05-21 ENCOUNTER — Observation Stay
Admission: EM | Admit: 2022-05-21 | Discharge: 2022-05-22 | Disposition: A | Discharge status: Home / Self Care | Payer: Commercial Managed Care - HMO | Attending: Internal Medicine | Admitting: Internal Medicine

## 2022-05-21 ENCOUNTER — Other Ambulatory Visit: Payer: Self-pay

## 2022-05-21 ENCOUNTER — Observation Stay (HOSPITAL_BASED_OUTPATIENT_CLINIC_OR_DEPARTMENT_OTHER)
Admit: 2022-05-21 | Discharge: 2022-05-21 | Disposition: A | Discharge status: Home / Self Care | Payer: Commercial Managed Care - HMO | Attending: Internal Medicine | Admitting: Internal Medicine

## 2022-05-21 DIAGNOSIS — R112 Nausea with vomiting, unspecified: Principal | ICD-10-CM

## 2022-05-21 DIAGNOSIS — F32A Depression, unspecified: Secondary | ICD-10-CM | POA: Insufficient documentation

## 2022-05-21 DIAGNOSIS — R55 Syncope and collapse: Secondary | ICD-10-CM

## 2022-05-21 DIAGNOSIS — K219 Gastro-esophageal reflux disease without esophagitis: Secondary | ICD-10-CM | POA: Insufficient documentation

## 2022-05-21 DIAGNOSIS — I10 Essential (primary) hypertension: Secondary | ICD-10-CM | POA: Diagnosis not present

## 2022-05-21 DIAGNOSIS — D649 Anemia, unspecified: Secondary | ICD-10-CM | POA: Insufficient documentation

## 2022-05-21 DIAGNOSIS — Z91040 Latex allergy status: Secondary | ICD-10-CM | POA: Insufficient documentation

## 2022-05-21 DIAGNOSIS — U071 COVID-19: Secondary | ICD-10-CM | POA: Insufficient documentation

## 2022-05-21 DIAGNOSIS — R531 Weakness: Secondary | ICD-10-CM | POA: Insufficient documentation

## 2022-05-21 DIAGNOSIS — J45909 Unspecified asthma, uncomplicated: Secondary | ICD-10-CM | POA: Insufficient documentation

## 2022-05-21 DIAGNOSIS — F419 Anxiety disorder, unspecified: Secondary | ICD-10-CM | POA: Diagnosis not present

## 2022-05-21 DIAGNOSIS — E860 Dehydration: Secondary | ICD-10-CM | POA: Diagnosis not present

## 2022-05-21 DIAGNOSIS — Z79899 Other long term (current) drug therapy: Secondary | ICD-10-CM | POA: Insufficient documentation

## 2022-05-21 DIAGNOSIS — M810 Age-related osteoporosis without current pathological fracture: Secondary | ICD-10-CM | POA: Insufficient documentation

## 2022-05-21 LAB — COMPREHENSIVE METABOLIC PANEL
ALT: 17 U/L (ref 0–44)
AST: 22 U/L (ref 15–41)
Albumin: 2.9 g/dL — ABNORMAL LOW (ref 3.5–5.0)
Alkaline Phosphatase: 42 U/L (ref 38–126)
Anion gap: 7 (ref 5–15)
BUN: <5 mg/dL — ABNORMAL LOW (ref 8–23)
CO2: 20 mmol/L — ABNORMAL LOW (ref 22–32)
Calcium: 6.9 mg/dL — ABNORMAL LOW (ref 8.9–10.3)
Chloride: 114 mmol/L — ABNORMAL HIGH (ref 98–111)
Creatinine, Ser: 0.49 mg/dL (ref 0.44–1.00)
GFR, Estimated: >60 mL/min (ref >60)
Glucose, Bld: 88 mg/dL (ref 70–99)
Potassium: 3.1 mmol/L — ABNORMAL LOW (ref 3.5–5.1)
Sodium: 141 mmol/L (ref 135–145)
Total Bilirubin: 1.1 mg/dL (ref 0.3–1.2)
Total Protein: 5.5 g/dL — ABNORMAL LOW (ref 6.5–8.1)

## 2022-05-21 LAB — URINALYSIS, COMPLETE (UACMP) WITH MICROSCOPIC
Bilirubin Urine: NEGATIVE
Glucose, UA: NEGATIVE mg/dL
Hgb urine dipstick: NEGATIVE
Ketones, ur: NEGATIVE mg/dL
Nitrite: NEGATIVE
Protein, ur: NEGATIVE mg/dL
Specific Gravity, Urine: 1.003 — ABNORMAL LOW (ref 1.005–1.030)
pH: 8 (ref 5.0–8.0)

## 2022-05-21 LAB — MAGNESIUM: Magnesium: 2.2 mg/dL (ref 1.7–2.4)

## 2022-05-21 LAB — CBC
HCT: 39.3 % (ref 36.0–46.0)
Hemoglobin: 12.2 g/dL (ref 12.0–15.0)
MCH: 26 pg (ref 26.0–34.0)
MCHC: 31 g/dL (ref 30.0–36.0)
MCV: 83.8 fL (ref 80.0–100.0)
Platelets: 146 10*3/uL — ABNORMAL LOW (ref 150–400)
RBC: 4.69 MIL/uL (ref 3.87–5.11)
RDW: 13.6 % (ref 11.5–15.5)
WBC: 7.7 10*3/uL (ref 4.0–10.5)
nRBC: 0 % (ref 0.0–0.2)

## 2022-05-21 LAB — TROPONIN I (HIGH SENSITIVITY): Troponin I (High Sensitivity): 3 ng/L (ref <18)

## 2022-05-21 LAB — ECHOCARDIOGRAM COMPLETE

## 2022-05-21 LAB — LIPASE, BLOOD: Lipase: 39 U/L (ref 11–51)

## 2022-05-21 MED ORDER — ACETAMINOPHEN 650 MG RE SUPP: 650.0000 mg | Freq: Four times a day (QID) | RECTAL | Status: DC | PRN

## 2022-05-21 MED ORDER — LACTATED RINGERS IV BOLUS
1000.0000 mL | Freq: Once | INTRAVENOUS | Status: AC
  Administered 2022-05-21: 1000 mL via INTRAVENOUS

## 2022-05-21 MED ORDER — ONDANSETRON HCL 4 MG/2ML IJ SOLN: 4.0000 mg | Freq: Once | INTRAMUSCULAR | Status: DC

## 2022-05-21 MED ORDER — POTASSIUM CHLORIDE 10 MEQ/100ML IV SOLN
10.0000 meq | INTRAVENOUS | Status: AC
  Administered 2022-05-21 (×3): 10 meq via INTRAVENOUS
  Filled 2022-05-21 (×3): qty 100

## 2022-05-21 MED ORDER — LURASIDONE HCL 20 MG PO TABS
20.0000 mg | ORAL_TABLET | Freq: Every day | ORAL | Status: DC
  Filled 2022-05-21: qty 1

## 2022-05-21 MED ORDER — CALCIUM GLUCONATE-NACL 1-0.675 GM/50ML-% IV SOLN
1.0000 g | Freq: Once | INTRAVENOUS | Status: AC
  Administered 2022-05-21: 1000 mg via INTRAVENOUS
  Filled 2022-05-21: qty 50

## 2022-05-21 MED ORDER — ENOXAPARIN SODIUM 40 MG/0.4ML IJ SOSY
40.0000 mg | PREFILLED_SYRINGE | INTRAMUSCULAR | Status: DC
  Administered 2022-05-21: 40 mg via SUBCUTANEOUS
  Filled 2022-05-21: qty 0.4

## 2022-05-21 MED ORDER — METOCLOPRAMIDE HCL 5 MG/ML IJ SOLN
5.0000 mg | Freq: Four times a day (QID) | INTRAMUSCULAR | Status: DC | PRN
  Administered 2022-05-21: 5 mg via INTRAVENOUS
  Filled 2022-05-21 (×2): qty 2

## 2022-05-21 MED ORDER — LACTATED RINGERS IV SOLN: INTRAVENOUS | Status: AC

## 2022-05-21 MED ORDER — ALPRAZOLAM 0.5 MG PO TABS
0.5000 mg | ORAL_TABLET | Freq: Every day | ORAL | Status: DC | PRN
  Administered 2022-05-21: 0.5 mg via ORAL
  Filled 2022-05-21: qty 1

## 2022-05-21 MED ORDER — KETOROLAC TROMETHAMINE 30 MG/ML IJ SOLN
15.0000 mg | Freq: Once | INTRAMUSCULAR | Status: AC
  Administered 2022-05-21: 15 mg via INTRAVENOUS
  Filled 2022-05-21: qty 1

## 2022-05-21 MED ORDER — ESCITALOPRAM OXALATE 10 MG PO TABS
10.0000 mg | ORAL_TABLET | Freq: Every day | ORAL | Status: DC
  Administered 2022-05-21 – 2022-05-22 (×2): 10 mg via ORAL
  Filled 2022-05-21 (×2): qty 1

## 2022-05-21 MED ORDER — CALCIUM CARBONATE ANTACID 500 MG PO CHEW
1.0000 | CHEWABLE_TABLET | Freq: Once | ORAL | Status: AC
  Administered 2022-05-21: 200 mg via ORAL
  Filled 2022-05-21: qty 1

## 2022-05-21 MED ORDER — SODIUM CHLORIDE 0.9 % IV BOLUS
1000.0000 mL | Freq: Once | INTRAVENOUS | Status: AC
  Administered 2022-05-21: 1000 mL via INTRAVENOUS

## 2022-05-21 MED ORDER — AMLODIPINE BESYLATE 5 MG PO TABS
5.0000 mg | ORAL_TABLET | Freq: Every day | ORAL | Status: DC
  Administered 2022-05-22: 5 mg via ORAL
  Filled 2022-05-21: qty 1

## 2022-05-21 MED ORDER — LORAZEPAM 2 MG/ML IJ SOLN
0.5000 mg | Freq: Once | INTRAMUSCULAR | Status: AC
  Administered 2022-05-21: 0.5 mg via INTRAVENOUS
  Filled 2022-05-21: qty 1

## 2022-05-21 MED ORDER — QUETIAPINE FUMARATE 25 MG PO TABS
50.0000 mg | ORAL_TABLET | Freq: Every day | ORAL | Status: DC
  Administered 2022-05-21: 50 mg via ORAL
  Filled 2022-05-21: qty 2

## 2022-05-21 MED ORDER — ACETAMINOPHEN 325 MG PO TABS: 650.0000 mg | ORAL_TABLET | Freq: Four times a day (QID) | ORAL | Status: DC | PRN

## 2022-05-21 MED ORDER — METOPROLOL SUCCINATE ER 25 MG PO TB24
25.0000 mg | ORAL_TABLET | Freq: Every day | ORAL | Status: DC
  Administered 2022-05-21 – 2022-05-22 (×2): 25 mg via ORAL
  Filled 2022-05-21 (×2): qty 1

## 2022-05-21 MED ORDER — PANTOPRAZOLE SODIUM 40 MG PO TBEC
40.0000 mg | DELAYED_RELEASE_TABLET | Freq: Every day | ORAL | Status: DC
  Administered 2022-05-22: 40 mg via ORAL
  Filled 2022-05-21: qty 1

## 2022-05-21 MED ORDER — ALBUTEROL SULFATE (2.5 MG/3ML) 0.083% IN NEBU: 3.0000 mL | INHALATION_SOLUTION | Freq: Four times a day (QID) | RESPIRATORY_TRACT | Status: DC | PRN

## 2022-05-21 MED ORDER — METOCLOPRAMIDE HCL 5 MG/ML IJ SOLN
5.0000 mg | Freq: Once | INTRAMUSCULAR | Status: AC
  Administered 2022-05-21: 5 mg via INTRAVENOUS
  Filled 2022-05-21: qty 2

## 2022-05-21 NOTE — ED Notes (Signed)
Pt. Provided ice and diet soda per request. Dr. Lenard Lance ok'd.

## 2022-05-21 NOTE — Assessment & Plan Note (Signed)
-   Will restart home antihypertensives pending orthostatic vital signs

## 2022-05-21 NOTE — ED Provider Notes (Signed)
[facility] Provider Note    Event Date/Time   First MD Initiated Contact with Patient 05/21/22 0725     (approximate)  History   Chief Complaint: No chief complaint on file.  HPI  Lakeidra Reliford is a 67 y.o. female with a past medical history of gastric reflux, hypertension, presents emergency department for nausea and vomiting as well as generalized weakness/dizziness.  According to the patient she was diagnosed with COVID 2 weeks ago since that time she has been feeling nauseated dizzy lightheaded.  Had a near syncopal episode this morning and called EMS.  Patient states over the past 2 weeks she has not been able to eat or drink much due to decreased appetite and persistent nausea.  Patient denies any respiratory symptoms denies any cough congestion or shortness of breath.  Denies any recent fever within the past 1 week or so.  No diarrhea this week either.  Patient denies any urinary symptoms.  Denies any chest pain or shortness of breath.  Physical Exam   Triage Vital Signs: ED Triage Vitals  Enc Vitals Group     BP      Pulse      Resp      Temp      Temp src      SpO2      Weight      Height      Head Circumference      Peak Flow      Pain Score      Pain Loc      Pain Edu?      Excl. in [place]?     Most recent vital signs: There were no vitals filed for this visit.  General: Awake, no distress.  CV:  Good peripheral perfusion.  Regular rate and rhythm  Resp:  Normal effort.  Equal breath sounds bilaterally.  Abd:  No distention.  Soft, nontender.  No rebound or guarding.  ED Results / Procedures / Treatments   EKG  EKG viewed and interpreted by myself shows what appears to be sinus rhythm at 70 bpm with a narrow QRS, deviation, largely normal intervals nonspecific but no concerning ST changes.  MEDICATIONS ORDERED IN ED: Medications  sodium chloride 0.9 % bolus 1,000 mL (has no administration in time range)  ketorolac (TORADOL) 30 MG/ML  injection 15 mg (has no administration in time range)  ondansetron (ZOFRAN) injection 4 mg (has no administration in time range)     IMPRESSION / MDM / ASSESSMENT AND PLAN / ED COURSE  I reviewed the triage vital signs and the nursing notes.  Patient's presentation is most consistent with acute presentation with potential threat to life or bodily function.  Patient presents to the emergency department with continued dizziness/lightheadedness, near syncopal episode nausea and vomiting which have been ongoing over the past 2 weeks since the patient was diagnosed with COVID.  No respiratory symptoms.  No chest pain.  We will IV hydrate, treat with Toradol Zofran we will check labs and continue to closely monitor.  Patient agreeable to plan of care.  Patient's labs have resulted showing a reassuring urinalysis, normal CBC with a normal white blood cell count, reassuringly negative troponin.  Patient's chemistry however shows significant hypocalcemia which could greatly contribute to the patient's weakness especially muscular weakness that the patient is experiencing.  Daughter is now here who states the patient had 3 near syncopal episodes this morning.  Patient receiving fluids we will also dose IV  calcium gluconate as well as oral calcium.  Given the patient's significant hypocalcemia with generalized weakness and multiple near syncopal episodes will admit to the hospital service for further workup and treatment.  FINAL CLINICAL IMPRESSION(S) / ED DIAGNOSES   COVID-19 Weakness Near syncope/dehydration Hypocalcemia  Note:  This document was prepared using Dragon voice recognition software and may include unintentional dictation errors.   Minna Antis, MD 05/21/22 1228

## 2022-05-21 NOTE — Assessment & Plan Note (Signed)
Patient is presenting with 1 week of intractable nausea/vomiting, gradually worsening dizziness and 3 short lived syncopal episodes this morning while resting in bed.  Prior history of SVTs, and given electrolyte abnormalities, certainly at higher risk at this time.  Patient has a history of seizures, however no seizure activity in at least 10 years and clinical history not completely consistent.   - Telemetry monitoring - Electrolyte abnormality management as noted below - Seizure precautions - Echocardiogram ordered - Orthostatic vital signs - Continue IV fluids

## 2022-05-21 NOTE — H&P (Signed)
History and Physical    Patient: Sarah Tapia DOB: [birth year] DOA: 05/21/2022 DOS: the patient was seen and examined on 05/21/2022 PCP: Dorothey Baseman, MD  Patient coming from: Home  Chief Complaint:  Chief Complaint  Patient presents with   Emesis    Pt. To ED via EMS for nausea, vomiting, and dizziness x3 weeks. Pt. Was seen in ED Friday for same. Pt. Tested positive for covid 2 weeks ago.    HPI: Sarah Tapia is a 67 y.o. female with medical history significant of hypertension, hyperlipidemia, SVT, moderate persistent asthma, osteoporosis, presents to the ED due to vomiting and dizziness.  Sarah Tapia states that she was diagnosed with COVID-19 approximately 2 weeks ago.  At that time she was experiencing fever, cough, and bodyaches.  All her symptoms resolved gradually, however over the last 1 week, she has been experiencing persistent nausea with vomiting.  She has been unable to hold any food or water down without vomiting after approximately 5 minutes.  In addition, she has been experiencing generalized weakness and dizziness.  She states that dizziness was initially predominantly while moving but then now is occurring while she is resting in bed.  Today, she called her daughter as she was feeling unwell.  Her daughter states that when she arrived, patient was in bed and seems to have "passed out".  She noted that at the time, her mom had some bilateral upper extremity tremors but she states that it was not frank shaking.  She states her mom seem to come in and out every few minutes and once EMS arrived, she improved.  No altered mental status after this episode.  No urinary or bowel incontinence, tongue biting.  Sarah Tapia denies any chest pain, palpitations, shortness of breath.  ED course: On arrival to the ED, patient was hypertensive at 150/82 with heart rate of 80.  She is saturating at 97% on room air.  She was afebrile at 97.9.  Workup demonstrated WBC of 7.7, hemoglobin  12.2, platelets 146, potassium 3.1, bicarb 20, calcium 6.9, albumin 2.9, total protein 5.5 and GFR above 60.  Troponin negative at 3.  Urinalysis with trace leukocytes and rare bacteria.  Patient started on IV fluids, calcium gluconate, Toradol and Ativan.  TRH contacted for admission.  Review of Systems: As mentioned in the history of present illness. All other systems reviewed and are negative.  Past Medical History:  Diagnosis Date   Gastritis    GERD (gastroesophageal reflux disease)    Hypertension    Past Surgical History:  Procedure Laterality Date   ABDOMINAL HYSTERECTOMY     Social History:  reports that she has never smoked. She has never used smokeless tobacco. She reports that she does not drink alcohol and does not use drugs.  Allergies  Allergen Reactions   Latex Hives   Penicillins Hives and Itching   Ivp Dye [Iodinated Contrast Media] Hives and Itching    Family History  Problem Relation Age of Onset   Breast cancer Sister 34    Prior to Admission medications   Medication Sig Start Date End Date Taking? Authorizing Provider  albuterol (PROVENTIL HFA;VENTOLIN HFA) 108 (90 Base) MCG/ACT inhaler Inhale 1-2 puffs into the lungs every 6 (six) hours as needed for wheezing or shortness of breath.    [provider]  ALPRAZolam Prudy Feeler) 0.5 MG tablet Take 0.5 mg by mouth daily as needed. 03/25/17   [provider]  amLODipine (NORVASC) 5 MG tablet Take 5 mg  by mouth daily.    [provider]  calcium-vitamin D (Ruthell Rummage WITH D) 500-5 MG-MCG tablet Take 1 tablet by mouth daily with breakfast. 02/01/22 03/03/22  Georga Hacking, MD  Cholecalciferol (VITAMIN D-3) 1000 units CAPS Take 1,000 Units by mouth daily.    [provider]  escitalopram (LEXAPRO) 10 MG tablet Take 10 mg by mouth daily. 02/25/17   [provider]  meclizine (ANTIVERT) 25 MG tablet Take 1 tablet (25 mg total) by mouth 3 (three) times daily as needed for dizziness or  nausea. Patient not taking: Reported on 04/11/2017 04/20/15   Governor Rooks, MD  Multiple Vitamin (MULTIVITAMIN WITH MINERALS) TABS tablet Take 1 tablet by mouth daily.    [provider]  ondansetron (ZOFRAN-ODT) 4 MG disintegrating tablet Take 1 tablet (4 mg total) by mouth every 8 (eight) hours as needed for nausea or vomiting. 05/17/22   Chesley Noon, MD  pantoprazole (PROTONIX) 40 MG tablet Take 40 mg by mouth daily.    [provider]  potassium chloride SA (KLOR-CON M) 20 MEQ tablet Take 1 tablet (20 mEq total) by mouth 2 (two) times daily for 1 day. 02/01/22 02/02/22  Georga Hacking, MD  zoledronic acid (RECLAST) 5 MG/100ML SOLN injection Inject 5 mg into the vein once.    [provider]  zolpidem (AMBIEN) 5 MG tablet Take 5 mg by mouth at bedtime as needed. 01/30/17   [provider]    Physical Exam: Vitals:   05/21/22 1130 05/21/22 1200 05/21/22 1230 05/21/22 1300  BP: 126/73 117/63    Pulse: 65 64 78 65  Resp: 15 13 18 19   Temp:      TempSrc:      SpO2: 98% 99% 99% 98%  Weight:      Height:       Physical Exam Vitals and nursing note reviewed.  Constitutional:      Appearance: She is normal weight. She is ill-appearing.  HENT:     Head: Normocephalic and atraumatic.     Mouth/Throat:     Mouth: Mucous membranes are dry.     Pharynx: Oropharynx is clear.  Eyes:     Conjunctiva/sclera: Conjunctivae normal.     Pupils: Pupils are equal, round, and reactive to light.  Cardiovascular:     Rate and Rhythm: Normal rate and regular rhythm.     Heart sounds: No murmur heard.    No gallop.  Pulmonary:     Effort: Pulmonary effort is normal. No respiratory distress.     Breath sounds: No wheezing or rales.     Comments: Diminished breath sounds throughout Abdominal:     General: Bowel sounds are normal. There is no distension.     Palpations: Abdomen is soft.     Tenderness: There is abdominal tenderness (Epigastric tenderness with deep  palpation only). There is no guarding.  Musculoskeletal:     Cervical back: Neck supple.     Right lower leg: No edema.     Left lower leg: No edema.  Skin:    General: Skin is warm and dry.     Findings: No erythema or rash.  Neurological:     General: No focal deficit present.     Mental Status: She is alert and oriented to person, place, and time.     Cranial Nerves: No dysarthria or facial asymmetry.     Motor: No tremor or seizure activity.  Psychiatric:        Mood  and Affect: Mood normal.        Behavior: Behavior normal.    Data Reviewed: CBC with WBC 7.7, hemoglobin 12.2, MCV 83 and platelets of 146 CMP with sodium of 141, potassium 3.1, chloride 114, bicarb 20, glucose 88, BUN less than 5, creatinine 0.49, calcium 6.9, albumin 2.9, AST 22, ALT 17, total protein 5.5 and GFR above 60 Troponin negative at 3 Urinalysis with trace leukocytes, and rare bacteria  EKG personally reviewed.  Sinus rhythm with rate of 70.  T wave flattening noted.  Results are pending, will review when available.  Assessment and Plan:  * Syncope Patient is presenting with 1 week of intractable nausea/vomiting, gradually worsening dizziness and 3 short lived syncopal episodes this morning while resting in bed.  Prior history of SVTs, and given electrolyte abnormalities, certainly at higher risk at this time.  Patient has a history of seizures, however no seizure activity in at least 10 years and clinical history not completely consistent.   - Telemetry monitoring - Electrolyte abnormality management as noted below - Seizure precautions - Echocardiogram ordered - Orthostatic vital signs - Continue IV fluids  Intractable nausea and vomiting Patient presenting with 1 week of intractable nausea and vomiting approximately 1 week after her COVID-19 symptoms improved.  Differential includes viral induced gastroparesis.  Given epigastric tenderness, we will also check for pancreatitis.  Of note,  hypokalemia and hypocalcemia noted in the setting of vomiting.  - Trial of Reglan.  If no improvement, can switch back to Zofran - IV fluids - Lipase pending - Potassium supplementation 40 mEq - S/p 1 g of calcium gluconate - Magnesium level pending  Hypertension - Will restart home antihypertensives pending orthostatic vital signs  Anxiety - Restart home hydroxyzine, Lexapro, as needed Xanax and Seroquel  Advance Care Planning:   Code Status: Full Code verified by patient  Consults: None  Family Communication: Patient's daughter updated at bedside  Severity of Illness: The appropriate patient status for this patient is OBSERVATION. Observation status is judged to be reasonable and necessary in order to provide the required intensity of service to ensure the patient's safety. The patient's presenting symptoms, physical exam findings, and initial radiographic and laboratory data in the context of their medical condition is felt to place them at decreased risk for further clinical deterioration. Furthermore, it is anticipated that the patient will be medically stable for discharge from the hospital within 2 midnights of admission.   Author: Verdene Lennert, MD 05/21/2022 1:51 PM  For on call review [email or web address].

## 2022-05-21 NOTE — ED Triage Notes (Signed)
Pt. To ED via EMS for nausea, vomiting, and dizziness x3 weeks. Pt. Was seen in ED Friday for same. Pt. Tested positive for covid 2 weeks ago.

## 2022-05-21 NOTE — Assessment & Plan Note (Signed)
-   Restart home hydroxyzine, Lexapro, as needed Xanax and Seroquel

## 2022-05-21 NOTE — Assessment & Plan Note (Addendum)
Patient presenting with 1 week of intractable nausea and vomiting approximately 1 week after her COVID-19 symptoms improved.  Differential includes viral induced gastroparesis.  Given epigastric tenderness, we will also check for pancreatitis.  Of note, hypokalemia and hypocalcemia noted in the setting of vomiting.  - Trial of Reglan.  If no improvement, can switch back to Zofran - IV fluids - Lipase pending - Potassium supplementation 40 mEq - S/p 1 g of calcium gluconate - Magnesium level pending

## 2022-05-21 NOTE — ED Notes (Signed)
Pt. Up to toilet in room with daughter standby assist ad lib.

## 2022-05-21 NOTE — ED Notes (Signed)
Pt. Up to toilet in room ad lib with daughter stand by assist. NAD.

## 2022-05-21 NOTE — Evaluation (Addendum)
Occupational Therapy Evaluation Patient Details Name: Sarah Tapia MRN: [id number] DOB: October 12, 1955 Today's Date: 05/21/2022   History of Present Illness 67 y.o. female with medical history significant of hypertension, hyperlipidemia, SVT, moderate persistent asthma, osteoporosis, presents to the ED due to vomiting and dizziness.   Clinical Impression   Pt was seen for OT evaluation this date. Prior to hospital admission and prior to recently feeling unwell, pt was independent in all aspects. Pt lives in a 1 story home with 1 STE. Son lives with her and if needed, pt could have 24/7 assist available. Pt presents to acute OT demonstrating mildly impaired ADL performance and functional mobility 2/2 dizziness with positional changes, impairing balance and BLE weakness (See OT problem list). Pt currently requires supv for bed mobility and handheld assist/CGA for standing. Unable to tolerate standing long enough to get standing BP and requiring her to return to supine. Pt also endorsing new IV (K+ and IVF started) was starting to burn. RN notified of both BP and IV. Pt would benefit from skilled OT services while hospitalized to ensure pt continues to improve medically to ensure no functional decline in order to maximize safety and independence while minimizing falls risk and caregiver burden. Do not anticipate skilled OT needs upon discharge, pending medical improvement.     Addendum: Attempted orthostatics during assessment: Supine 167/92, sitting EOB 169/102, unable to get standing BP 2/2 dizziness requiring return to supine. RN notified.   Recommendations for follow up therapy are one component of a multi-disciplinary discharge planning process, led by the attending physician.  Recommendations may be updated based on patient status, additional functional criteria and insurance authorization.   Assistance Recommended at Discharge PRN  Patient can return home with the following A little help with walking  and/or transfers;A little help with bathing/dressing/bathroom;Assist for transportation    Functional Status Assessment  Patient has had a recent decline in their functional status and demonstrates the ability to make significant improvements in function in a reasonable and predictable amount of time.  Equipment Recommendations  None recommended by OT    Recommendations for Other Services       Precautions / Restrictions Precautions Precautions: Fall Restrictions Weight Bearing Restrictions: No      Mobility Bed Mobility Overal bed mobility: Modified Independent               Patient Response: Anxious  Transfers Overall transfer level: Needs assistance Equipment used: 1 person hand held assist Transfers: Sit to/from Stand Sit to Stand: Min guard           General transfer comment: upon standing, pt noted increased BLE weakness and dizziness requiring her to return to supine      Balance Overall balance assessment: Needs assistance Sitting-balance support: No upper extremity supported, Feet unsupported Sitting balance-Leahy Scale: Good     Standing balance support: Single extremity supported Standing balance-Leahy Scale: Fair                             ADL either performed or assessed with clinical judgement   ADL                                         General ADL Comments: Pt declined to trial LB dressing, preferring son to don her socks so she would avoid dizziness with bending over to  her feet. Normally independent. Per nursing notes, pt has been up to the bathroom for toileting with daughter ad lib.     Vision         Perception     Praxis      Pertinent Vitals/Pain Pain Assessment Pain Assessment:  (no pain until IV began to burn at end of session, RN notified)     Hand Dominance Right   Extremity/Trunk Assessment Upper Extremity Assessment Upper Extremity Assessment: Overall WFL for tasks assessed    Lower Extremity Assessment Lower Extremity Assessment: Generalized weakness       Communication Communication Communication: No difficulties   Cognition Arousal/Alertness: Awake/alert Behavior During Therapy: Anxious Overall Cognitive Status: Within Functional Limits for tasks assessed                                 General Comments: endorsed mildly anxious     General Comments       Exercises Other Exercises Other Exercises: Pt educated in PLB to support anxious feelings   Shoulder Instructions      Home Living Family/patient expects to be discharged to:: Private residence Living Arrangements: Children (son) Available Help at Discharge: Available 24 hours/day;Family Type of Home: House Home Access: Stairs to enter Secretary/administrator of Steps: 1 Entrance Stairs-Rails: None Home Layout: One level     Bathroom Shower/Tub: Walk-in shower;Tub only   Bathroom Toilet: Standard     Home Equipment: None          Prior Functioning/Environment Prior Level of Function : Independent/Modified Independent                        OT Problem List: Impaired balance (sitting and/or standing);Cardiopulmonary status limiting activity      OT Treatment/Interventions: Self-care/ADL training;Therapeutic activities;DME and/or AE instruction;Patient/family education;Balance training    OT Goals(Current goals can be found in the care plan section) Acute Rehab OT Goals Patient Stated Goal: feel better OT Goal Formulation: With patient/family Time For Goal Achievement: 06/04/22 Potential to Achieve Goals: Good ADL Goals Additional ADL Goal #1: Pt will complete all aspects of toileting with modified independence. Additional ADL Goal #2: Pt will complete all aspects of dressing with modified independence. Additional ADL Goal #3: Pt will verbalize plan to implement at least 1 learned falls prevention strategy.  OT Frequency: Min 1X/week     Co-evaluation              AM-PAC OT "6 Clicks" Daily Activity     Outcome Measure Help from another person eating meals?: None Help from another person taking care of personal grooming?: None Help from another person toileting, which includes using toliet, bedpan, or urinal?: A Little Help from another person bathing (including washing, rinsing, drying)?: A Little Help from another person to put on and taking off regular upper body clothing?: None Help from another person to put on and taking off regular lower body clothing?: A Little 6 Click Score: 21   End of Session Nurse Communication: Mobility status;Other (comment) (IV burning, orthostatics attempted)  Activity Tolerance: Treatment limited secondary to medical complications (Comment) Patient left: in bed;with call bell/phone within reach;with family/visitor present  OT Visit Diagnosis: Other abnormalities of gait and mobility (R26.89)                Time: 1610-9604 OT Time Calculation (min): 20 min Charges:  OT General Charges $OT Visit: 1  Visit OT Evaluation $OT Eval Low Complexity: 1 Low  Arman Filter., MPH, MS, OTR/L ascom [phone] 05/21/22, 5:02 PM

## 2022-05-22 DIAGNOSIS — E860 Dehydration: Secondary | ICD-10-CM

## 2022-05-22 LAB — CBC WITH DIFFERENTIAL/PLATELET
Abs Immature Granulocytes: 0.01 10*3/uL (ref 0.00–0.07)
Basophils Absolute: 0 10*3/uL (ref 0.0–0.1)
Basophils Relative: 0 %
Eosinophils Absolute: 0 10*3/uL (ref 0.0–0.5)
Eosinophils Relative: 1 %
HCT: 40.6 % (ref 36.0–46.0)
Hemoglobin: 12.3 g/dL (ref 12.0–15.0)
Immature Granulocytes: 0 %
Lymphocytes Relative: 49 %
Lymphs Abs: 3.3 10*3/uL (ref 0.7–4.0)
MCH: 25.8 pg — ABNORMAL LOW (ref 26.0–34.0)
MCHC: 30.3 g/dL (ref 30.0–36.0)
MCV: 85.3 fL (ref 80.0–100.0)
Monocytes Absolute: 0.5 10*3/uL (ref 0.1–1.0)
Monocytes Relative: 8 %
Neutro Abs: 2.8 10*3/uL (ref 1.7–7.7)
Neutrophils Relative %: 42 %
Platelets: 236 10*3/uL (ref 150–400)
RBC: 4.76 MIL/uL (ref 3.87–5.11)
RDW: 13 % (ref 11.5–15.5)
WBC: 6.6 10*3/uL (ref 4.0–10.5)
nRBC: 0 % (ref 0.0–0.2)

## 2022-05-22 LAB — COMPREHENSIVE METABOLIC PANEL
ALT: 19 U/L (ref 0–44)
AST: 24 U/L (ref 15–41)
Albumin: 3.4 g/dL — ABNORMAL LOW (ref 3.5–5.0)
Alkaline Phosphatase: 52 U/L (ref 38–126)
Anion gap: 6 (ref 5–15)
BUN: 8 mg/dL (ref 8–23)
CO2: 27 mmol/L (ref 22–32)
Calcium: 8.7 mg/dL — ABNORMAL LOW (ref 8.9–10.3)
Chloride: 106 mmol/L (ref 98–111)
Creatinine, Ser: 0.82 mg/dL (ref 0.44–1.00)
GFR, Estimated: >60 mL/min (ref >60)
Glucose, Bld: 95 mg/dL (ref 70–99)
Potassium: 3.4 mmol/L — ABNORMAL LOW (ref 3.5–5.1)
Sodium: 139 mmol/L (ref 135–145)
Total Bilirubin: 1.4 mg/dL — ABNORMAL HIGH (ref 0.3–1.2)
Total Protein: 6.5 g/dL (ref 6.5–8.1)

## 2022-05-22 LAB — ECHOCARDIOGRAM COMPLETE
Area-P 1/2: 3.24 cm2
Height: 63 in
S' Lateral: 2.9 cm
Weight: 2336 oz

## 2022-05-22 LAB — HIV ANTIBODY (ROUTINE TESTING W REFLEX)
HIV Screen 4th Generation wRfx: NONREACTIVE
HIV Screen 4th Generation wRfx: NONREACTIVE

## 2022-05-22 MED ORDER — ONDANSETRON HCL 4 MG PO TABS
4.0000 mg | ORAL_TABLET | Freq: Three times a day (TID) | ORAL | Status: DC | PRN
  Administered 2022-05-22: 4 mg via ORAL
  Filled 2022-05-22: qty 1

## 2022-05-22 MED ORDER — POTASSIUM CHLORIDE CRYS ER 20 MEQ PO TBCR
40.0000 meq | EXTENDED_RELEASE_TABLET | Freq: Once | ORAL | Status: AC
  Administered 2022-05-22: 40 meq via ORAL
  Filled 2022-05-22: qty 2

## 2022-05-22 NOTE — Progress Notes (Signed)
Late entry Patient discharge today Therapy recommendations review.  Patient states that she already has a Rw in the home.  Prefers outpatient PT at [facility].  Order form sent to MD for signature and to be faxed to [facility] outpatient therapy

## 2022-05-22 NOTE — Progress Notes (Signed)
Discharge education completed.   Cornell Barman Sarah Tapia

## 2022-05-22 NOTE — Plan of Care (Signed)
Patient is stable for discharge.  Sarah Tapia  

## 2022-05-22 NOTE — Discharge Summary (Signed)
Physician Discharge Summary   Patient: Sarah Tapia MRN: [id number] DOB: 1955-08-20  Admit date:     05/21/2022  Discharge date: 05/22/22  Discharge Physician: Loyce Dys   PCP: Dorothey Baseman, MD    Discharge Diagnoses:  syncope likely secondary to dehydration Intractable nausea and vomiting Hypertension Anxiety  Hospital Course: Sarah Tapia states that she was diagnosed with COVID-19 approximately 2 weeks ago.  At that time she was experiencing fever, cough, and bodyaches.  All her symptoms resolved gradually, however over the last 1 week, she has been experiencing persistent nausea with vomiting.  She has been unable to hold any food or water down without vomiting after approximately 5 minutes.  In addition, she has been experiencing generalized weakness and dizziness.  She states that dizziness was initially predominantly while moving but then now is occurring while she is resting in bed.  Today, she called her daughter as she was feeling unwell.  Her daughter states that when she arrived, patient was in bed and seems to have "passed out". On arrival to the ED, patient was hypertensive at 150/82 with heart rate of 80.  Given persistent intractable nausea and vomiting hospitalist was asked admit patient for further management.  Patient was found to be clinically dehydrated requiring IV fluid therapy.  Her dizziness improved and patient was able to walk with physical therapy.  Patient is cleared for discharge today and to follow-up with home PT  Consultants: None Procedures performed: None Disposition: Home health Diet recommendation:  Discharge Diet Orders (From admission, onward)     Start     Ordered   05/22/22 0000  Diet - low sodium heart healthy        05/22/22 1215           Cardiac diet DISCHARGE MEDICATION: Allergies as of 05/22/2022       Reactions   Latex Hives   Penicillins Hives, Itching   Ivp Dye [iodinated Contrast Media] Hives, Itching        Medication  List     STOP taking these medications    calcium-vitamin D 500-5 MG-MCG tablet Commonly known as: OSCAL WITH D   hydrOXYzine 25 MG capsule Commonly known as: VISTARIL   Reclast 5 MG/100ML Soln injection Generic drug: zoledronic acid       TAKE these medications    albuterol 108 (90 Base) MCG/ACT inhaler Commonly known as: VENTOLIN HFA Inhale 1-2 puffs into the lungs every 6 (six) hours as needed for wheezing or shortness of breath.   ALPRAZolam 0.5 MG tablet Commonly known as: XANAX Take 0.5 mg by mouth daily as needed.   amLODipine 5 MG tablet Commonly known as: NORVASC Take 5 mg by mouth daily.   escitalopram 10 MG tablet Commonly known as: LEXAPRO Take 10 mg by mouth daily.   HYDROcodone bit-homatropine 5-1.5 MG/5ML syrup Commonly known as: HYCODAN Take by mouth.   lurasidone 20 MG Tabs tablet Commonly known as: LATUDA SMARTSIG:1 Tablet(s) By Mouth Every Evening   metoprolol succinate 25 MG 24 hr tablet Commonly known as: TOPROL-XL Take 1 tablet by mouth daily.   multivitamin with minerals Tabs tablet Take 1 tablet by mouth daily.   ondansetron 4 MG disintegrating tablet Commonly known as: ZOFRAN-ODT Take 1 tablet (4 mg total) by mouth every 8 (eight) hours as needed for nausea or vomiting.   pantoprazole 40 MG tablet Commonly known as: PROTONIX Take 40 mg by mouth daily.   QUEtiapine 50 MG tablet Commonly known as: SEROQUEL  Take 50 mg by mouth at bedtime.   Vitamin D (Ergocalciferol) 1.25 MG (50000 UNIT) Caps capsule Commonly known as: DRISDOL Take 50,000 Units by mouth 2 (two) times a week.   Vitamin D-3 25 MCG (1000 UT) Caps Take 1,000 Units by mouth daily.   zolpidem 5 MG tablet Commonly known as: AMBIEN Take 5 mg by mouth at bedtime as needed.               Durable Medical Equipment  (From admission, onward)           Start     Ordered   05/22/22 1216  For home use only DME Walker rolling  Once       Question Answer  Comment  Walker: With 5 Inch Wheels   Patient needs a walker to treat with the following condition Ambulatory dysfunction      05/22/22 1216            Discharge Exam: Filed Weights   05/21/22 0738  Weight: 66.2 kg   Vitals and nursing note reviewed.  Constitutional:      Appearance: She is normal weight.  Eyes:     Conjunctiva/sclera: Conjunctivae normal.  Cardiovascular:     Rate and Rhythm: Normal rate and regular rhythm.  Pulmonary:     Effort: Pulmonary effort is normal. No respiratory distress.  Abdominal:     General: Bowel sounds are normal. There is no distension.  Skin:    General: Skin is warm and dry.  Neurological:     General: No focal deficit present.   Condition at discharge: good  Discharge time spent: greater than 30 minutes.  Signed: Loyce Dys, MD [facility] 05/22/2022

## 2022-05-22 NOTE — Progress Notes (Addendum)
     [facility] REFERRAL        Occupational Therapy * Physical Therapy * Speech Therapy                           DATE 05/22/22   PATIENT NAME Sarah Tapia   PATIENT MRN  [id number]       DIAGNOSIS/DIAGNOSIS CODE   DATE OF DISCHARGE: 05/22/22        PRIMARY CARE PHYSICIAN    [place]  PCP PHONE/FAX      Dear Provider (Name: [facility] outpatient __  Fax: [phone]   I certify that I have examined this patient and that occupational/physical/speech therapy is necessary on an outpatient basis.    The patient has expressed interest in completing their recommended course of therapy at your  location.  Once a formal order from the patient's primary care physician has been obtained, please  contact him/her to schedule an appointment for evaluation at your earliest convenience.   [x ]  Physical Therapy Evaluate and Treat  [  ]  Occupational Therapy Evaluate and Treat  [  ]  Speech Therapy Evaluate and Treat         The patient's primary care physician (listed above) must furnish and be responsible for a formal order such that the recommended services may be furnished while under the primary physician's care, and that the plan of care will be established and reviewed every 30 days (or more often if condition necessitates).

## 2022-05-22 NOTE — Evaluation (Signed)
Physical Therapy Evaluation Patient Details Name: Sarah Tapia MRN: [id number] DOB: 06-Oct-1955 Today's Date: 05/22/2022  History of Present Illness  67 y.o. female with medical history significant of hypertension, hyperlipidemia, SVT, moderate persistent asthma, osteoporosis, presents to the ED due to vomiting and dizziness.  Clinical Impression  Patient received in bed, she is agreeable to PT assessment. Patient's daughter present in room. Patient reports continued dizziness with mobility. BP remained stable and WNL throughout orthostatic testing. She is mod I for bed mobility and transfers. Ambulated with min guard and RW ~30 feet. Patient ambulates with VERY slow pace, reporting dizziness and fearful of falling. She will continue to benefit from skilled PT to improve strength, activity tolerance and safety.        Recommendations for follow up therapy are one component of a multi-disciplinary discharge planning process, led by the attending physician.  Recommendations may be updated based on patient status, additional functional criteria and insurance authorization.  Follow Up Recommendations       Assistance Recommended at Discharge Frequent or constant Supervision/Assistance  Patient can return home with the following  A little help with walking and/or transfers;Help with stairs or ramp for entrance;Assist for transportation    Equipment Recommendations Rolling walker (2 wheels)  Recommendations for Other Services       Functional Status Assessment Patient has had a recent decline in their functional status and demonstrates the ability to make significant improvements in function in a reasonable and predictable amount of time.     Precautions / Restrictions Precautions Precautions: Fall Restrictions Weight Bearing Restrictions: No      Mobility  Bed Mobility Overal bed mobility: Modified Independent               Patient Response: Anxious  Transfers Overall transfer  level: Modified independent Equipment used: Rolling walker (2 wheels) Transfers: Sit to/from Stand Sit to Stand: Modified independent (Device/Increase time)           General transfer comment: patient with some shaking of LEs in standing, therefore used walker    Ambulation/Gait Ambulation/Gait assistance: Min guard Gait Distance (Feet): 30 Feet Assistive device: Rolling walker (2 wheels) Gait Pattern/deviations: Step-through pattern, Decreased step length - right, Decreased step length - left, Narrow base of support Gait velocity: decreased     General Gait Details: patient hesitant with ambulation, no lob or knee buckling, reporting dizziness throughout mobility. BP checked in sitting, standing and standing for 3 min with BP in 120s/80s  Stairs            Wheelchair Mobility    Modified Rankin (Stroke Patients Only)       Balance Overall balance assessment: Needs assistance Sitting-balance support: Feet supported Sitting balance-Leahy Scale: Normal     Standing balance support: Bilateral upper extremity supported, During functional activity Standing balance-Leahy Scale: Good                               Pertinent Vitals/Pain Pain Assessment Pain Assessment: No/denies pain    Home Living Family/patient expects to be discharged to:: Private residence Living Arrangements: Children Available Help at Discharge: Available 24 hours/day;Family Type of Home: House Home Access: Stairs to enter Entrance Stairs-Rails: None Entrance Stairs-Number of Steps: 1   Home Layout: One level Home Equipment: None      Prior Function Prior Level of Function : Independent/Modified Independent             Mobility  Comments: no AD prior       Hand Dominance   Dominant Hand: Right    Extremity/Trunk Assessment   Upper Extremity Assessment Upper Extremity Assessment: Overall WFL for tasks assessed    Lower Extremity Assessment Lower Extremity  Assessment: Generalized weakness    Cervical / Trunk Assessment Cervical / Trunk Assessment: Normal  Communication   Communication: No difficulties  Cognition Arousal/Alertness: Awake/alert Behavior During Therapy: Anxious Overall Cognitive Status: Within Functional Limits for tasks assessed                                 General Comments: endorsed mildly anxious        General Comments      Exercises     Assessment/Plan    PT Assessment Patient needs continued PT services  PT Problem List Decreased strength;Decreased activity tolerance;Decreased balance;Decreased mobility;Decreased knowledge of use of DME       PT Treatment Interventions DME instruction;Gait training;Therapeutic exercise;Stair training;Functional mobility training;Therapeutic activities;Patient/family education    PT Goals (Current goals can be found in the Care Plan section)  Acute Rehab PT Goals Patient Stated Goal: return home, decrease dizziness PT Goal Formulation: With patient Time For Goal Achievement: 05/28/22 Potential to Achieve Goals: Good    Frequency Min 2X/week     Co-evaluation               AM-PAC PT "6 Clicks" Mobility  Outcome Measure Help needed turning from your back to your side while in a flat bed without using bedrails?: None Help needed moving from lying on your back to sitting on the side of a flat bed without using bedrails?: None Help needed moving to and from a bed to a chair (including a wheelchair)?: A Little Help needed standing up from a chair using your arms (e.g., wheelchair or bedside chair)?: None Help needed to walk in hospital room?: A Little Help needed climbing 3-5 steps with a railing? : A Little 6 Click Score: 21    End of Session Equipment Utilized During Treatment: Gait belt Activity Tolerance: Other (comment) (limited by reported dizziness) Patient left: in bed;with call bell/phone within reach;with family/visitor  present Nurse Communication: Mobility status PT Visit Diagnosis: Dizziness and giddiness (R42);Muscle weakness (generalized) (M62.81)    Time: 4098-1191 PT Time Calculation (min) (ACUTE ONLY): 21 min   Charges:   PT Evaluation $PT Eval Moderate Complexity: 1 Mod PT Treatments $Gait Training: 8-22 mins        Damiah Mcdonald, PT, GCS 05/22/22,10:33 AM

## 2022-10-21 DIAGNOSIS — F410 Panic disorder [episodic paroxysmal anxiety] without agoraphobia: Secondary | ICD-10-CM | POA: Diagnosis not present

## 2022-10-21 DIAGNOSIS — F411 Generalized anxiety disorder: Secondary | ICD-10-CM | POA: Diagnosis not present

## 2022-10-21 DIAGNOSIS — F39 Unspecified mood [affective] disorder: Secondary | ICD-10-CM | POA: Diagnosis not present

## 2022-10-21 DIAGNOSIS — F4312 Post-traumatic stress disorder, chronic: Secondary | ICD-10-CM | POA: Diagnosis not present

## 2022-10-21 DIAGNOSIS — F5105 Insomnia due to other mental disorder: Secondary | ICD-10-CM | POA: Diagnosis not present

## 2022-10-31 DIAGNOSIS — E538 Deficiency of other specified B group vitamins: Secondary | ICD-10-CM | POA: Diagnosis not present

## 2022-11-14 DIAGNOSIS — E538 Deficiency of other specified B group vitamins: Secondary | ICD-10-CM | POA: Diagnosis not present

## 2022-12-09 DIAGNOSIS — R002 Palpitations: Secondary | ICD-10-CM | POA: Diagnosis not present

## 2022-12-09 DIAGNOSIS — I10 Essential (primary) hypertension: Secondary | ICD-10-CM | POA: Diagnosis not present

## 2022-12-09 DIAGNOSIS — E785 Hyperlipidemia, unspecified: Secondary | ICD-10-CM | POA: Diagnosis not present

## 2022-12-09 DIAGNOSIS — I471 Supraventricular tachycardia, unspecified: Secondary | ICD-10-CM | POA: Diagnosis not present

## 2022-12-13 DIAGNOSIS — J209 Acute bronchitis, unspecified: Secondary | ICD-10-CM | POA: Diagnosis not present

## 2022-12-13 DIAGNOSIS — I10 Essential (primary) hypertension: Secondary | ICD-10-CM | POA: Diagnosis not present

## 2023-01-16 DIAGNOSIS — F411 Generalized anxiety disorder: Secondary | ICD-10-CM | POA: Diagnosis not present

## 2023-01-16 DIAGNOSIS — F4312 Post-traumatic stress disorder, chronic: Secondary | ICD-10-CM | POA: Diagnosis not present

## 2023-01-16 DIAGNOSIS — F39 Unspecified mood [affective] disorder: Secondary | ICD-10-CM | POA: Diagnosis not present

## 2023-01-16 DIAGNOSIS — F410 Panic disorder [episodic paroxysmal anxiety] without agoraphobia: Secondary | ICD-10-CM | POA: Diagnosis not present

## 2023-01-16 DIAGNOSIS — F5105 Insomnia due to other mental disorder: Secondary | ICD-10-CM | POA: Diagnosis not present

## 2023-02-10 ENCOUNTER — Emergency Department: Payer: HMO

## 2023-02-10 ENCOUNTER — Encounter: Payer: Self-pay | Admitting: Emergency Medicine

## 2023-02-10 ENCOUNTER — Emergency Department
Admission: EM | Admit: 2023-02-10 | Discharge: 2023-02-10 | Discharge status: Against Medical Advice | Payer: HMO | Attending: Emergency Medicine | Admitting: Emergency Medicine

## 2023-02-10 DIAGNOSIS — R202 Paresthesia of skin: Secondary | ICD-10-CM | POA: Diagnosis not present

## 2023-02-10 DIAGNOSIS — R0789 Other chest pain: Secondary | ICD-10-CM | POA: Insufficient documentation

## 2023-02-10 DIAGNOSIS — Z5321 Procedure and treatment not carried out due to patient leaving prior to being seen by health care provider: Secondary | ICD-10-CM | POA: Insufficient documentation

## 2023-02-10 DIAGNOSIS — F419 Anxiety disorder, unspecified: Secondary | ICD-10-CM | POA: Insufficient documentation

## 2023-02-10 DIAGNOSIS — R002 Palpitations: Secondary | ICD-10-CM | POA: Diagnosis not present

## 2023-02-10 HISTORY — DX: Anxiety disorder, unspecified: F41.9

## 2023-02-10 LAB — CBC
HCT: 41.9 % (ref 36.0–46.0)
Hemoglobin: 13.3 g/dL (ref 12.0–15.0)
MCH: 26.8 pg (ref 26.0–34.0)
MCHC: 31.7 g/dL (ref 30.0–36.0)
MCV: 84.3 fL (ref 80.0–100.0)
Platelets: 283 10*3/uL (ref 150–400)
RBC: 4.97 MIL/uL (ref 3.87–5.11)
RDW: 13.2 % (ref 11.5–15.5)
WBC: 7.7 10*3/uL (ref 4.0–10.5)
nRBC: 0 % (ref 0.0–0.2)

## 2023-02-10 LAB — BASIC METABOLIC PANEL
Anion gap: 11 (ref 5–15)
BUN: 9 mg/dL (ref 8–23)
CO2: 27 mmol/L (ref 22–32)
Calcium: 8.9 mg/dL (ref 8.9–10.3)
Chloride: 101 mmol/L (ref 98–111)
Creatinine, Ser: 0.73 mg/dL (ref 0.44–1.00)
GFR, Estimated: >60 mL/min (ref >60)
Glucose, Bld: 113 mg/dL — ABNORMAL HIGH (ref 70–99)
Potassium: 4 mmol/L (ref 3.5–5.1)
Sodium: 139 mmol/L (ref 135–145)

## 2023-02-10 LAB — TROPONIN I (HIGH SENSITIVITY): Troponin I (High Sensitivity): 3 ng/L (ref <18)

## 2023-02-10 NOTE — ED Triage Notes (Signed)
 States after waking up felt dizzy, felt heart palpitations, felt tingling to left side of face and arm and headache.  States sees a cardiologist for blood pressure and palpitations.  Also has anxiety, but states symptoms do not feel like an anxiety attack.  AAOx3.  Skin warm and dry. NAD  C/O chest tightness and resolving tingling feeling to left face and left arm  MAE equally and strong.  Equal sensation bilaterally.

## 2023-02-10 NOTE — ED Notes (Signed)
 Pt

## 2023-02-10 NOTE — ED Notes (Signed)
 Pt called to be roomed, no answer. This RN called pt's cell, pt states she went home due to the wait time.

## 2023-02-11 NOTE — ED Provider Notes (Signed)
 Patient left prior to being roomed. I did not evaluate the patient.    Trinna Post, MD 02/11/23 [phone]

## 2023-02-13 DIAGNOSIS — R002 Palpitations: Secondary | ICD-10-CM | POA: Diagnosis not present

## 2023-02-13 DIAGNOSIS — E538 Deficiency of other specified B group vitamins: Secondary | ICD-10-CM | POA: Diagnosis not present

## 2023-02-13 DIAGNOSIS — F419 Anxiety disorder, unspecified: Secondary | ICD-10-CM | POA: Diagnosis not present

## 2023-02-13 DIAGNOSIS — R42 Dizziness and giddiness: Secondary | ICD-10-CM | POA: Diagnosis not present

## 2023-02-13 DIAGNOSIS — R519 Headache, unspecified: Secondary | ICD-10-CM | POA: Diagnosis not present

## 2023-02-13 DIAGNOSIS — I10 Essential (primary) hypertension: Secondary | ICD-10-CM | POA: Diagnosis not present

## 2023-03-26 DIAGNOSIS — I259 Chronic ischemic heart disease, unspecified: Secondary | ICD-10-CM | POA: Diagnosis not present

## 2023-03-26 DIAGNOSIS — R002 Palpitations: Secondary | ICD-10-CM | POA: Diagnosis not present

## 2023-03-27 DIAGNOSIS — E538 Deficiency of other specified B group vitamins: Secondary | ICD-10-CM | POA: Diagnosis not present

## 2023-04-09 DIAGNOSIS — R634 Abnormal weight loss: Secondary | ICD-10-CM | POA: Diagnosis not present

## 2023-04-09 DIAGNOSIS — E538 Deficiency of other specified B group vitamins: Secondary | ICD-10-CM | POA: Diagnosis not present

## 2023-04-09 DIAGNOSIS — R002 Palpitations: Secondary | ICD-10-CM | POA: Diagnosis not present

## 2023-04-09 DIAGNOSIS — R7309 Other abnormal glucose: Secondary | ICD-10-CM | POA: Diagnosis not present

## 2023-04-09 DIAGNOSIS — E559 Vitamin D deficiency, unspecified: Secondary | ICD-10-CM | POA: Diagnosis not present

## 2023-04-15 DIAGNOSIS — F410 Panic disorder [episodic paroxysmal anxiety] without agoraphobia: Secondary | ICD-10-CM | POA: Diagnosis not present

## 2023-04-15 DIAGNOSIS — F39 Unspecified mood [affective] disorder: Secondary | ICD-10-CM | POA: Diagnosis not present

## 2023-04-15 DIAGNOSIS — F5105 Insomnia due to other mental disorder: Secondary | ICD-10-CM | POA: Diagnosis not present

## 2023-04-15 DIAGNOSIS — F4312 Post-traumatic stress disorder, chronic: Secondary | ICD-10-CM | POA: Diagnosis not present

## 2023-04-15 DIAGNOSIS — F411 Generalized anxiety disorder: Secondary | ICD-10-CM | POA: Diagnosis not present

## 2023-04-25 DIAGNOSIS — E538 Deficiency of other specified B group vitamins: Secondary | ICD-10-CM | POA: Diagnosis not present

## 2023-05-02 ENCOUNTER — Emergency Department

## 2023-05-02 ENCOUNTER — Other Ambulatory Visit: Payer: Self-pay

## 2023-05-02 ENCOUNTER — Emergency Department
Admission: EM | Admit: 2023-05-02 | Discharge: 2023-05-03 | Disposition: A | Discharge status: Other Acute Inpatient Hospital | Attending: Student in an Organized Health Care Education/Training Program | Admitting: Student in an Organized Health Care Education/Training Program

## 2023-05-02 DIAGNOSIS — K8051 Calculus of bile duct without cholangitis or cholecystitis with obstruction: Secondary | ICD-10-CM

## 2023-05-02 DIAGNOSIS — I10 Essential (primary) hypertension: Secondary | ICD-10-CM | POA: Insufficient documentation

## 2023-05-02 DIAGNOSIS — K7689 Other specified diseases of liver: Secondary | ICD-10-CM | POA: Diagnosis not present

## 2023-05-02 DIAGNOSIS — K851 Biliary acute pancreatitis without necrosis or infection: Secondary | ICD-10-CM | POA: Insufficient documentation

## 2023-05-02 DIAGNOSIS — K828 Other specified diseases of gallbladder: Secondary | ICD-10-CM | POA: Diagnosis not present

## 2023-05-02 DIAGNOSIS — R932 Abnormal findings on diagnostic imaging of liver and biliary tract: Secondary | ICD-10-CM | POA: Diagnosis not present

## 2023-05-02 DIAGNOSIS — D72829 Elevated white blood cell count, unspecified: Secondary | ICD-10-CM | POA: Insufficient documentation

## 2023-05-02 DIAGNOSIS — K8020 Calculus of gallbladder without cholecystitis without obstruction: Secondary | ICD-10-CM | POA: Diagnosis not present

## 2023-05-02 DIAGNOSIS — K8021 Calculus of gallbladder without cholecystitis with obstruction: Secondary | ICD-10-CM | POA: Diagnosis not present

## 2023-05-02 DIAGNOSIS — R1110 Vomiting, unspecified: Secondary | ICD-10-CM | POA: Diagnosis not present

## 2023-05-02 DIAGNOSIS — R1011 Right upper quadrant pain: Secondary | ICD-10-CM | POA: Diagnosis not present

## 2023-05-02 DIAGNOSIS — K5790 Diverticulosis of intestine, part unspecified, without perforation or abscess without bleeding: Secondary | ICD-10-CM | POA: Diagnosis not present

## 2023-05-02 DIAGNOSIS — R1013 Epigastric pain: Secondary | ICD-10-CM | POA: Diagnosis not present

## 2023-05-02 LAB — URINALYSIS, ROUTINE W REFLEX MICROSCOPIC
Bilirubin Urine: NEGATIVE
Glucose, UA: NEGATIVE mg/dL
Hgb urine dipstick: NEGATIVE
Ketones, ur: NEGATIVE mg/dL
Nitrite: NEGATIVE
Protein, ur: NEGATIVE mg/dL
Specific Gravity, Urine: 1.005 (ref 1.005–1.030)
pH: 6 (ref 5.0–8.0)

## 2023-05-02 LAB — COMPREHENSIVE METABOLIC PANEL WITH GFR
ALT: 83 U/L — ABNORMAL HIGH (ref 0–44)
AST: 136 U/L — ABNORMAL HIGH (ref 15–41)
Albumin: 3.9 g/dL (ref 3.5–5.0)
Alkaline Phosphatase: 71 U/L (ref 38–126)
Anion gap: 8 (ref 5–15)
BUN: 17 mg/dL (ref 8–23)
CO2: 29 mmol/L (ref 22–32)
Calcium: 9.3 mg/dL (ref 8.9–10.3)
Chloride: 101 mmol/L (ref 98–111)
Creatinine, Ser: 0.82 mg/dL (ref 0.44–1.00)
GFR, Estimated: >60 mL/min (ref >60)
Glucose, Bld: 92 mg/dL (ref 70–99)
Potassium: 4.7 mmol/L (ref 3.5–5.1)
Sodium: 138 mmol/L (ref 135–145)
Total Bilirubin: 2.2 mg/dL — ABNORMAL HIGH (ref 0.0–1.2)
Total Protein: 7.4 g/dL (ref 6.5–8.1)

## 2023-05-02 LAB — CBC
HCT: 40.8 % (ref 36.0–46.0)
Hemoglobin: 13.3 g/dL (ref 12.0–15.0)
MCH: 28 pg (ref 26.0–34.0)
MCHC: 32.6 g/dL (ref 30.0–36.0)
MCV: 85.9 fL (ref 80.0–100.0)
Platelets: 281 10*3/uL (ref 150–400)
RBC: 4.75 MIL/uL (ref 3.87–5.11)
RDW: 13 % (ref 11.5–15.5)
WBC: 12 10*3/uL — ABNORMAL HIGH (ref 4.0–10.5)
nRBC: 0 % (ref 0.0–0.2)

## 2023-05-02 LAB — LIPASE, BLOOD: Lipase: 3921 U/L — ABNORMAL HIGH (ref 11–51)

## 2023-05-02 MED ORDER — SODIUM CHLORIDE 0.9 % IV BOLUS
1000.0000 mL | Freq: Once | INTRAVENOUS | Status: AC
  Administered 2023-05-02: 1000 mL via INTRAVENOUS

## 2023-05-02 MED ORDER — ACETAMINOPHEN 325 MG PO TABS
650.0000 mg | ORAL_TABLET | Freq: Once | ORAL | Status: AC
  Administered 2023-05-02: 650 mg via ORAL
  Filled 2023-05-02: qty 2

## 2023-05-02 MED ORDER — ALPRAZOLAM 0.5 MG PO TABS
0.5000 mg | ORAL_TABLET | Freq: Once | ORAL | Status: AC
  Administered 2023-05-02: 0.5 mg via ORAL
  Filled 2023-05-02: qty 1

## 2023-05-02 MED ORDER — SODIUM CHLORIDE 0.9 % IV SOLN
2.0000 g | Freq: Three times a day (TID) | INTRAVENOUS | Status: DC
  Administered 2023-05-02: 2 g via INTRAVENOUS
  Filled 2023-05-02: qty 12.5

## 2023-05-02 MED ORDER — GADOBUTROL 1 MMOL/ML IV SOLN
6.0000 mL | Freq: Once | INTRAVENOUS | Status: AC | PRN
  Administered 2023-05-02: 6 mL via INTRAVENOUS

## 2023-05-02 NOTE — ED Triage Notes (Signed)
 Pt to Ed via POV from [facility]. Pt reports epigastric pain with radiation to back and N/V x3 days. Pt reports vomit is now yellow. Pt with hx of barrets esophagus. Pt denies blood thinner.

## 2023-05-02 NOTE — ED Provider Notes (Signed)
-----------------------------------------   11:03 PM on 05/02/2023 -----------------------------------------  Assuming care from Dr. Roxan Hockey.  In short, Sarah Tapia is a 68 y.o. female with a chief complaint of abdominal pain, likely gallstone pancreatitis.  Refer to the original H&P for additional details.  The current plan of care is to follow up on MRCP to determine if patient has obstructcive stone.  If so, will need transfer to [facility].  Otherwise can be admitted here.   Clinical Course as of 05/03/23 1610  Sat May 03, 2023  0004 MR ABDOMEN MRCP W WO CONTAST I viewed and interpreted the patient's MRCP/abdominal MRI.  I can appreciate that there is no obvious cholecystitis.  However, the radiologist report confirms that although it is equivocal for cholecystitis, the patient does have multiple filling defects in the common bile duct consistent with choledocholithiasis.  I reviewed the documentation from the patient's prior provider and have requested that my secretary page [facility] to consult the hospitalist at [facility].  Both the hospitalist and the GI on-call physician at [facility] have already been consulted on the patient and should be aware of the plan to transfer if choledocholithiasis is present [CF]  0017 Consulted with Dr. Antionette Char at [facility].  He understands the situation and accepted the patient for transfer.  Confirmed that we are awaiting bed assignment.  Patient updated by nursing staff [CF]    Clinical Course User Index [CF] [name], MD     Medications  ceFEPIme (MAXIPIME) 2 g in sodium chloride 0.9 % 100 mL IVPB (0 g Intravenous Stopped 05/02/23 2118)  acetaminophen (TYLENOL) tablet 650 mg (650 mg Oral Given 05/02/23 1957)  ALPRAZolam Prudy Feeler) tablet 0.5 mg (0.5 mg Oral Given 05/02/23 1957)  sodium chloride 0.9 % bolus 1,000 mL (0 mLs Intravenous Stopped 05/02/23 2136)  gadobutrol (GADAVIST) 1 MMOL/ML injection 6 mL (6 mLs Intravenous Contrast Given 05/02/23 2317)     ED  Discharge Orders     None      Final diagnoses:  Gallstone pancreatitis  Calculus of bile duct without cholecystitis with obstruction     [name], MD 05/03/23 [phone]

## 2023-05-02 NOTE — ED Notes (Signed)
 Called [facility] spoke with rep. Kennon Rounds. Rep is paging the hospitalist for transfer and will give [place] a call back shortly.

## 2023-05-02 NOTE — ED Provider Notes (Signed)
 [facility] Provider Note    Event Date/Time   First MD Initiated Contact with Patient 05/02/23 1806     (approximate)   History   Abdominal Pain   HPI  Sarah Tapia is a 68 y.o. female with PMH of hypertension, GERD and anxiety who presents for evaluation of abdominal pain with nausea and vomiting for the past 3 days.  States that the pain is located in the epigastric region.  She says the pain comes and goes but she has not been able to identify a pattern.  She reports that she has an appointment with GI scheduled for next month as she is followed due to her history of Barrett's esophagus.  She was not able to get an earlier appointment which is what is bringing her in today.  She states that the most recent vomit has been yellow in color but denies coffee-ground emesis and bloody emesis.      Physical Exam   Triage Vital Signs: ED Triage Vitals  Encounter Vitals Group     BP 05/02/23 1653 (!) 151/90     Systolic BP Percentile --      Diastolic BP Percentile --      Pulse Rate 05/02/23 1653 72     Resp 05/02/23 1653 16     Temp 05/02/23 1653 98 F (36.7 C)     Temp Source 05/02/23 1653 Oral     SpO2 05/02/23 1653 96 %     Weight 05/02/23 1653 132 lb (59.9 kg)     Height 05/02/23 1653 5\' 3"  (1.6 m)     Head Circumference --      Peak Flow --      Pain Score 05/02/23 1656 8     Pain Loc --      Pain Education --      Exclude from Growth Chart --     Most recent vital signs: Vitals:   05/02/23 1930 05/02/23 2041  BP: (!) 147/78 (!) 143/86  Pulse: 66 71  Resp:  18  Temp:    SpO2: 99% 100%   General: Awake, no distress.  CV:  Good peripheral perfusion.  RRR. Resp:  Normal effort.  CTAB. Abd:  No distention.  Soft, very tender to palpation in the epigastric region with positive Murphy sign, bowel sounds appropriate.    ED Results / Procedures / Treatments   Labs (all labs ordered are listed, but only abnormal results are  displayed) Labs Reviewed  LIPASE, BLOOD - Abnormal; Notable for the following components:      Result Value   Lipase 3,921 (*)    All other components within normal limits  COMPREHENSIVE METABOLIC PANEL WITH GFR - Abnormal; Notable for the following components:   AST 136 (*)    ALT 83 (*)    Total Bilirubin 2.2 (*)    All other components within normal limits  CBC - Abnormal; Notable for the following components:   WBC 12.0 (*)    All other components within normal limits  URINALYSIS, ROUTINE W REFLEX MICROSCOPIC - Abnormal; Notable for the following components:   Color, Urine YELLOW (*)    APPearance CLEAR (*)    Leukocytes,Ua LARGE (*)    Bacteria, UA RARE (*)    All other components within normal limits    EKG  ED provider interpretation: Normal sinus rhythm with no ST changes.  Vent. rate 75 BPM PR interval 118 ms QRS duration 78 ms QT/QTcB 374/417 ms  P-R-T axes 39 -21 50  RADIOLOGY  CT abdomen pelvis and right upper quadrant ultrasound obtained, interpreted the images as well as reviewed the radiologist report.  Right upper quadrant ultrasound shows cholelithiasis with gallbladder wall thickening consistent with acute cholecystitis.  CT abdomen pelvis also showed gallbladder wall thickening but did not identify any other abnormalities.  PROCEDURES:  Critical Care performed: No  Procedures   MEDICATIONS ORDERED IN ED: Medications  ceFEPIme (MAXIPIME) 2 g in sodium chloride 0.9 % 100 mL IVPB (0 g Intravenous Stopped 05/02/23 2118)  acetaminophen (TYLENOL) tablet 650 mg (650 mg Oral Given 05/02/23 1957)  ALPRAZolam (XANAX) tablet 0.5 mg (0.5 mg Oral Given 05/02/23 1957)  sodium chloride 0.9 % bolus 1,000 mL (0 mLs Intravenous Stopped 05/02/23 2136)     IMPRESSION / MDM / ASSESSMENT AND PLAN / ED COURSE  I reviewed the triage vital signs and the nursing notes.                             68 year old female presents for evaluation of epigastric abdominal pain.  Blood  pressure is elevated otherwise vital signs are stable.  Differential diagnosis includes, but is not limited to, gastritis, peptic ulcer, cholecystitis, pancreatitis.   Patient's presentation is most consistent with acute complicated illness / injury requiring diagnostic workup.  CBC notable for mild leukocytosis.  CMP shows elevated AST, ALT and total bilirubin.  Lipase is elevated at 3921.  Urinalysis shows presence of large leukocytes with white blood cells and rare bacteria.  Lab work concerning for cholecystitis versus pancreatitis.  Will obtain right upper quadrant ultrasound and CT scan.  EKG shows normal sinus rhythm low suspicion of ACS as a cause of the epigastric pain.  Ultrasound consistent with acute cholecystitis which is further confirmed by CT scan.  Given patient's elevated lipase believe this is gallstone pancreatitis.  Spoke with the on-call surgeon who recommended admission to the hospital.  She may have surgery tomorrow depending on her pain levels.  Started IV antibiotics.  Patient was given Tylenol for pain.  Offered stronger pain medication which she declined.  Patient did states she was feeling a bit anxious and normally takes half a Xanax at home when this happens so she was provided with this.  Spoke with the hospitalist who was unsure if patient was appropriate for admission here as she may need an ERCP and we do not have GI coverage who could complete this over the weekend.  Spoke with hospitalist and GI at [facility].  Hospitalist was agreeable for transfer pending GI approval.  GI recommended getting MRCP to determine whether ERCP was necessary.  If MRCP shows choledocholithiasis or ductal dilation then they would except transfer for ERCP.  If MRCP is negative for this patient would be appropriate for admission at [facility].  Patient was updated on all imaging and lab work results as well as care plan.  She voiced understanding, all questions were answered. She was  agreeable to admission or transfer. Care of the patient will be passed on to the oncoming provider pending MRCP results.      FINAL CLINICAL IMPRESSION(S) / ED DIAGNOSES   Final diagnoses:  Gallstone pancreatitis     Rx / DC Orders   ED Discharge Orders     None        Note:  This document was prepared using Dragon voice recognition software and may include unintentional dictation errors.   Robley Fries,  [place], PA-C 05/02/23 2251    Willy Eddy, MD 05/02/23 [phone]

## 2023-05-03 ENCOUNTER — Inpatient Hospital Stay (HOSPITAL_COMMUNITY)
Admission: EM | Admit: 2023-05-03 | Discharge: 2023-05-06 | DRG: 417 | Disposition: A | Discharge status: Home / Self Care | Source: Other Acute Inpatient Hospital | Attending: Internal Medicine | Admitting: Internal Medicine

## 2023-05-03 ENCOUNTER — Encounter (HOSPITAL_COMMUNITY): Payer: Self-pay

## 2023-05-03 ENCOUNTER — Encounter (HOSPITAL_COMMUNITY): Payer: Self-pay | Admitting: Internal Medicine

## 2023-05-03 DIAGNOSIS — R8290 Unspecified abnormal findings in urine: Secondary | ICD-10-CM | POA: Diagnosis not present

## 2023-05-03 DIAGNOSIS — K819 Cholecystitis, unspecified: Secondary | ICD-10-CM | POA: Diagnosis not present

## 2023-05-03 DIAGNOSIS — Z888 Allergy status to other drugs, medicaments and biological substances status: Secondary | ICD-10-CM

## 2023-05-03 DIAGNOSIS — E876 Hypokalemia: Secondary | ICD-10-CM | POA: Diagnosis present

## 2023-05-03 DIAGNOSIS — F418 Other specified anxiety disorders: Secondary | ICD-10-CM | POA: Diagnosis not present

## 2023-05-03 DIAGNOSIS — F32A Depression, unspecified: Secondary | ICD-10-CM | POA: Diagnosis present

## 2023-05-03 DIAGNOSIS — K8066 Calculus of gallbladder and bile duct with acute and chronic cholecystitis without obstruction: Secondary | ICD-10-CM | POA: Diagnosis not present

## 2023-05-03 DIAGNOSIS — K851 Biliary acute pancreatitis without necrosis or infection: Secondary | ICD-10-CM | POA: Diagnosis not present

## 2023-05-03 DIAGNOSIS — Z90710 Acquired absence of both cervix and uterus: Secondary | ICD-10-CM | POA: Diagnosis not present

## 2023-05-03 DIAGNOSIS — I110 Hypertensive heart disease with heart failure: Secondary | ICD-10-CM | POA: Diagnosis not present

## 2023-05-03 DIAGNOSIS — E785 Hyperlipidemia, unspecified: Secondary | ICD-10-CM | POA: Diagnosis not present

## 2023-05-03 DIAGNOSIS — R748 Abnormal levels of other serum enzymes: Secondary | ICD-10-CM | POA: Diagnosis not present

## 2023-05-03 DIAGNOSIS — Z880 Allergy status to penicillin: Secondary | ICD-10-CM

## 2023-05-03 DIAGNOSIS — K2270 Barrett's esophagus without dysplasia: Secondary | ICD-10-CM | POA: Diagnosis not present

## 2023-05-03 DIAGNOSIS — Z91040 Latex allergy status: Secondary | ICD-10-CM | POA: Diagnosis not present

## 2023-05-03 DIAGNOSIS — K8050 Calculus of bile duct without cholangitis or cholecystitis without obstruction: Secondary | ICD-10-CM | POA: Diagnosis not present

## 2023-05-03 DIAGNOSIS — K828 Other specified diseases of gallbladder: Secondary | ICD-10-CM | POA: Diagnosis not present

## 2023-05-03 DIAGNOSIS — K8020 Calculus of gallbladder without cholecystitis without obstruction: Secondary | ICD-10-CM | POA: Diagnosis not present

## 2023-05-03 DIAGNOSIS — E860 Dehydration: Secondary | ICD-10-CM | POA: Diagnosis present

## 2023-05-03 DIAGNOSIS — K219 Gastro-esophageal reflux disease without esophagitis: Secondary | ICD-10-CM | POA: Diagnosis not present

## 2023-05-03 DIAGNOSIS — K838 Other specified diseases of biliary tract: Secondary | ICD-10-CM | POA: Diagnosis not present

## 2023-05-03 DIAGNOSIS — J449 Chronic obstructive pulmonary disease, unspecified: Secondary | ICD-10-CM | POA: Diagnosis not present

## 2023-05-03 DIAGNOSIS — Z803 Family history of malignant neoplasm of breast: Secondary | ICD-10-CM | POA: Diagnosis not present

## 2023-05-03 DIAGNOSIS — Z79899 Other long term (current) drug therapy: Secondary | ICD-10-CM | POA: Diagnosis not present

## 2023-05-03 DIAGNOSIS — K8010 Calculus of gallbladder with chronic cholecystitis without obstruction: Secondary | ICD-10-CM | POA: Diagnosis not present

## 2023-05-03 DIAGNOSIS — I5032 Chronic diastolic (congestive) heart failure: Secondary | ICD-10-CM | POA: Diagnosis present

## 2023-05-03 DIAGNOSIS — Z91041 Radiographic dye allergy status: Secondary | ICD-10-CM

## 2023-05-03 DIAGNOSIS — I10 Essential (primary) hypertension: Secondary | ICD-10-CM | POA: Diagnosis not present

## 2023-05-03 DIAGNOSIS — K5710 Diverticulosis of small intestine without perforation or abscess without bleeding: Secondary | ICD-10-CM | POA: Diagnosis not present

## 2023-05-03 HISTORY — DX: Prediabetes: R73.03

## 2023-05-03 LAB — CBC WITH DIFFERENTIAL/PLATELET
Abs Immature Granulocytes: 0.02 10*3/uL (ref 0.00–0.07)
Basophils Absolute: 0 10*3/uL (ref 0.0–0.1)
Basophils Relative: 1 %
Eosinophils Absolute: 0 10*3/uL (ref 0.0–0.5)
Eosinophils Relative: 1 %
HCT: 41 % (ref 36.0–46.0)
Hemoglobin: 13.4 g/dL (ref 12.0–15.0)
Immature Granulocytes: 0 %
Lymphocytes Relative: 27 %
Lymphs Abs: 1.6 10*3/uL (ref 0.7–4.0)
MCH: 27.6 pg (ref 26.0–34.0)
MCHC: 32.7 g/dL (ref 30.0–36.0)
MCV: 84.4 fL (ref 80.0–100.0)
Monocytes Absolute: 0.7 10*3/uL (ref 0.1–1.0)
Monocytes Relative: 11 %
Neutro Abs: 3.6 10*3/uL (ref 1.7–7.7)
Neutrophils Relative %: 60 %
Platelets: 241 10*3/uL (ref 150–400)
RBC: 4.86 MIL/uL (ref 3.87–5.11)
RDW: 13.2 % (ref 11.5–15.5)
WBC: 5.9 10*3/uL (ref 4.0–10.5)
nRBC: 0 % (ref 0.0–0.2)

## 2023-05-03 LAB — COMPREHENSIVE METABOLIC PANEL WITH GFR
ALT: 736 U/L — ABNORMAL HIGH (ref 0–44)
AST: 1085 U/L — ABNORMAL HIGH (ref 15–41)
Albumin: 3.5 g/dL (ref 3.5–5.0)
Alkaline Phosphatase: 112 U/L (ref 38–126)
Anion gap: 5 (ref 5–15)
BUN: 7 mg/dL — ABNORMAL LOW (ref 8–23)
CO2: 31 mmol/L (ref 22–32)
Calcium: 8.9 mg/dL (ref 8.9–10.3)
Chloride: 106 mmol/L (ref 98–111)
Creatinine, Ser: 0.78 mg/dL (ref 0.44–1.00)
GFR, Estimated: >60 mL/min (ref >60)
Glucose, Bld: 97 mg/dL (ref 70–99)
Potassium: 3.4 mmol/L — ABNORMAL LOW (ref 3.5–5.1)
Sodium: 142 mmol/L (ref 135–145)
Total Bilirubin: 2.4 mg/dL — ABNORMAL HIGH (ref 0.0–1.2)
Total Protein: 7.1 g/dL (ref 6.5–8.1)

## 2023-05-03 LAB — ABO/RH: ABO/RH(D): B POS

## 2023-05-03 LAB — PROTIME-INR
INR: 1 (ref 0.8–1.2)
Prothrombin Time: 13.8 s (ref 11.4–15.2)

## 2023-05-03 LAB — TYPE AND SCREEN
ABO/RH(D): B POS
Antibody Screen: NEGATIVE

## 2023-05-03 LAB — BILIRUBIN, DIRECT: Bilirubin, Direct: 0.5 mg/dL — ABNORMAL HIGH (ref 0.0–0.2)

## 2023-05-03 LAB — SURGICAL PCR SCREEN
MRSA, PCR: NEGATIVE
Staphylococcus aureus: NEGATIVE

## 2023-05-03 LAB — MAGNESIUM: Magnesium: 2.1 mg/dL (ref 1.7–2.4)

## 2023-05-03 LAB — LIPASE, BLOOD: Lipase: 209 U/L — ABNORMAL HIGH (ref 11–51)

## 2023-05-03 MED ORDER — SODIUM CHLORIDE 0.9% FLUSH
3.0000 mL | Freq: Two times a day (BID) | INTRAVENOUS | Status: DC
  Administered 2023-05-03 – 2023-05-06 (×3): 3 mL via INTRAVENOUS

## 2023-05-03 MED ORDER — NALOXONE HCL 0.4 MG/ML IJ SOLN: 0.4000 mg | INTRAMUSCULAR | Status: DC | PRN

## 2023-05-03 MED ORDER — SODIUM CHLORIDE 0.9 % IV SOLN
2.0000 g | INTRAVENOUS | Status: DC
  Administered 2023-05-03 – 2023-05-06 (×4): 2 g via INTRAVENOUS
  Filled 2023-05-03 (×4): qty 20

## 2023-05-03 MED ORDER — KCL IN DEXTROSE-NACL 20-5-0.9 MEQ/L-%-% IV SOLN
INTRAVENOUS | Status: DC
  Filled 2023-05-03 (×2): qty 1000

## 2023-05-03 MED ORDER — ESCITALOPRAM OXALATE 20 MG PO TABS
20.0000 mg | ORAL_TABLET | Freq: Every day | ORAL | Status: DC
  Administered 2023-05-06: 20 mg via ORAL
  Filled 2023-05-03: qty 1

## 2023-05-03 MED ORDER — MORPHINE SULFATE (PF) 4 MG/ML IV SOLN
4.0000 mg | Freq: Once | INTRAVENOUS | Status: AC
  Administered 2023-05-03: 4 mg via INTRAVENOUS
  Filled 2023-05-03: qty 1

## 2023-05-03 MED ORDER — KCL IN DEXTROSE-NACL 20-5-0.45 MEQ/L-%-% IV SOLN
INTRAVENOUS | Status: AC
  Filled 2023-05-03 (×2): qty 1000

## 2023-05-03 MED ORDER — ACETAMINOPHEN 325 MG PO TABS
650.0000 mg | ORAL_TABLET | Freq: Four times a day (QID) | ORAL | Status: DC | PRN
  Administered 2023-05-06: 650 mg via ORAL
  Filled 2023-05-03: qty 2

## 2023-05-03 MED ORDER — METRONIDAZOLE 500 MG/100ML IV SOLN: 500.0000 mg | Freq: Two times a day (BID) | INTRAVENOUS | Status: DC

## 2023-05-03 MED ORDER — METRONIDAZOLE 500 MG/100ML IV SOLN
500.0000 mg | Freq: Two times a day (BID) | INTRAVENOUS | Status: DC
  Administered 2023-05-03 – 2023-05-06 (×6): 500 mg via INTRAVENOUS
  Filled 2023-05-03 (×6): qty 100

## 2023-05-03 MED ORDER — ACETAMINOPHEN 650 MG RE SUPP: 650.0000 mg | Freq: Four times a day (QID) | RECTAL | Status: DC | PRN

## 2023-05-03 MED ORDER — MELATONIN 3 MG PO TABS: 3.0000 mg | ORAL_TABLET | Freq: Every evening | ORAL | Status: DC | PRN

## 2023-05-03 MED ORDER — QUETIAPINE FUMARATE 50 MG PO TABS
50.0000 mg | ORAL_TABLET | Freq: Every day | ORAL | Status: DC
  Administered 2023-05-03 – 2023-05-04 (×2): 50 mg via ORAL
  Filled 2023-05-03 (×4): qty 1

## 2023-05-03 MED ORDER — PANTOPRAZOLE SODIUM 40 MG PO TBEC
40.0000 mg | DELAYED_RELEASE_TABLET | Freq: Every day | ORAL | Status: DC
  Administered 2023-05-06: 40 mg via ORAL
  Filled 2023-05-03: qty 1

## 2023-05-03 MED ORDER — AMLODIPINE BESYLATE 5 MG PO TABS
5.0000 mg | ORAL_TABLET | Freq: Every day | ORAL | Status: DC
  Administered 2023-05-06: 5 mg via ORAL
  Filled 2023-05-03: qty 1

## 2023-05-03 MED ORDER — CIPROFLOXACIN IN D5W 400 MG/200ML IV SOLN
400.0000 mg | Freq: Two times a day (BID) | INTRAVENOUS | Status: DC
  Filled 2023-05-03: qty 200

## 2023-05-03 MED ORDER — HYDROMORPHONE HCL 1 MG/ML IJ SOLN
0.5000 mg | INTRAMUSCULAR | Status: DC | PRN
  Administered 2023-05-04 – 2023-05-05 (×2): 0.5 mg via INTRAVENOUS
  Filled 2023-05-03 (×2): qty 0.5

## 2023-05-03 MED ORDER — LURASIDONE HCL 20 MG PO TABS: 20.0000 mg | ORAL_TABLET | Freq: Every day | ORAL | Status: DC

## 2023-05-03 MED ORDER — ONDANSETRON HCL 4 MG/2ML IJ SOLN
4.0000 mg | Freq: Once | INTRAMUSCULAR | Status: AC
  Administered 2023-05-03: 4 mg via INTRAVENOUS
  Filled 2023-05-03: qty 2

## 2023-05-03 MED ORDER — ONDANSETRON HCL 4 MG/2ML IJ SOLN
4.0000 mg | Freq: Four times a day (QID) | INTRAMUSCULAR | Status: DC | PRN
  Administered 2023-05-04: 4 mg via INTRAVENOUS
  Filled 2023-05-03: qty 2

## 2023-05-03 NOTE — Progress Notes (Addendum)
 Pt arrived in the unit. Alert oriented x 4, ambulating. Vital signs were taken. No complaints at the moment. Paged [facility] admits.

## 2023-05-03 NOTE — Consult Note (Signed)
 Consultation Note   Referring Provider:  [facility] PCP: Dorothey Baseman, MD Primary Gastroenterologist:: Eather Colas at  [facility]     Reason for Consultation:  CBD stones DOA: 05/03/2023         Hospital Day: 1   ASSESSMENT    68 y.o. year old female with a history of collagenous colitis, reported Barrett's esophagus, chronic diastolic heart failure, B12 deficiency  Cholelithiasis / choledocholithiasis / gallstone pancreatitis ( chemical) and possible acute cholecystitis Marked elevation in liver enzymes overnight. Pain improved ( without analgesics). HCT 41%  Mild leukocytosis  Resolved on antibiotics.    Suspected UTI.   GERD with reported Barrett's esophagus.  Asymptomatic on daily Pantoprazole.   ? History of Heller myotomy per patient (I don't see anything about this in record)   See PMH for additional history    PLAN:   --Getting Rocephin and Flagyl -- IV fluids going at 100 mL an hour.  Unable to determine if / how much fluid bolus that she got in the ED but Hct 41%  --Will need ERCP, ? Possibly tomorrow. The benefits and risks of ERCP with possible sphincterotomy not limited to cardiopulmonary complications of sedation, bleeding, infection, perforation,and pancreatitis were discussed with the patient who agrees to proceed.   -- General Surgery evaluated this a.m..  Will get cholecystectomy following removal of common bile duct stones  HPI   Patient presented to the ED at [facility] yesterday for evaluation of epigastric pain, nausea and vomiting.  In the ED her WBC was mildly elevated . LFTs were elevated, lipase was 3900.  UA suspicious for infection.  Imaging,  including MRCP was remarkable for cholelithiasis with choledocholithiasis and possibly acute cholecystitis.  Despite elevated lipase there was no radiologic findings of pancreatitis.  Patient started on IV antibiotics and transferred to [facility] for  ERCP.  RUQ ultrasound 1. Cholelithiasis, with gallbladder wall thickening measuring up to 6 mm. This is consistent with acute cholecystitis.    MRCP Cholelithiasis, with borderline gallbladder wall thickening measuring 4 mm. There is no pericholecystic fluid or other inflammatory change. Findings are equivocal for acute cholecystitis. 2. Multiple filling defects within the downstream common bile duct measuring up to 4 mm in diameter, compatible with choledocholithiasis. 3. No other acute findings.  Today:  Patient is afebrile.  WBC has normalized.  Hematocrit 41%.  Renal function normal.  Overnight she has had a marked increase in her transaminases.  Bilirubin up slightly overnight from 2.2 to 2.4.  Alk phos remains normal.    Previous GI Studies    Reportedly had a normal colonoscopy one year ago - [facility]   Labs and Imaging:  Recent Labs    05/02/23 1658 05/03/23 0541  WBC 12.0* 5.9  HGB 13.3 13.4  HCT 40.8 41.0  MCV 85.9 84.4  PLT 281 241   Recent Labs    05/02/23 1658 05/03/23 0541  NA 138 142  K 4.7 3.4*  CL 101 106  CO2 29 31  GLUCOSE 92 97  BUN 17 7*  CREATININE 0.82 0.78  CALCIUM 9.3 8.9   Recent Labs    05/02/23 1658 05/03/23 0541  PROT 7.4 7.1  ALBUMIN 3.9 3.5  AST 136* 1,085*  ALT 83* 736*  ALKPHOS 71 112  BILITOT 2.2* 2.4*  BILIDIR  --  0.5*   Recent Labs    05/03/23 0541  INR 1.0   No results for input(s): "AFPTUMOR" in the last 72 hours.  MR 3D Recon At Scanner CLINICAL DATA:  Right upper quadrant and epigastric pain, nausea and vomiting, cholelithiasis and gallbladder wall thickening on earlier imaging  EXAM: MRI ABDOMEN WITHOUT AND WITH CONTRAST (INCLUDING MRCP)  TECHNIQUE: Multiplanar multisequence MR imaging of the abdomen was performed both before and after the administration of intravenous contrast. Heavily T2-weighted images of the biliary and pancreatic ducts were obtained, and three-dimensional MRCP images were  rendered by post processing.  CONTRAST:  6mL GADAVIST GADOBUTROL 1 MMOL/ML IV SOLN  COMPARISON:  Ultrasound and CT 05/02/2023  FINDINGS: Lower chest: No acute pleural or parenchymal lung disease.  Hepatobiliary: Multiple large gallstones are identified within the gallbladder lumen. Mild gallbladder wall thickening measuring 4 mm. No pericholecystic fluid.  0.8 cm cyst within the inferior aspect right lobe liver. Otherwise liver parenchyma is unremarkable.  There is no intrahepatic biliary duct dilation. Cystic duct appears patent. The common bile duct measures up to 5 mm in diameter, with filling defects in the downstream common bile duct measuring up to 4 mm in diameter and extending approximately 2.3 cm in length, consistent with multiple choledocholithiasis.  Pancreas: No mass, inflammatory changes, or other parenchymal abnormality identified.  Spleen:  Within normal limits in size and appearance.  Adrenals/Urinary Tract: No masses identified. No evidence of hydronephrosis.  Stomach/Bowel: Visualized portions within the abdomen are unremarkable.  Vascular/Lymphatic: No pathologically enlarged lymph nodes identified. No abdominal aortic aneurysm demonstrated.  Other:  No free fluid.  No abdominal wall hernia.  Musculoskeletal: No suspicious bone lesions identified.  IMPRESSION: 1. Cholelithiasis, with borderline gallbladder wall thickening measuring 4 mm. There is no pericholecystic fluid or other inflammatory change. Findings are equivocal for acute cholecystitis. 2. Multiple filling defects within the downstream common bile duct measuring up to 4 mm in diameter, compatible with choledocholithiasis. 3. No other acute findings.  Electronically Signed   By: Sharlet Salina M.D.   On: 05/02/2023 23:52 MR ABDOMEN MRCP W WO CONTAST CLINICAL DATA:  Right upper quadrant and epigastric pain, nausea and vomiting, cholelithiasis and gallbladder wall thickening on  earlier imaging  EXAM: MRI ABDOMEN WITHOUT AND WITH CONTRAST (INCLUDING MRCP)  TECHNIQUE: Multiplanar multisequence MR imaging of the abdomen was performed both before and after the administration of intravenous contrast. Heavily T2-weighted images of the biliary and pancreatic ducts were obtained, and three-dimensional MRCP images were rendered by post processing.  CONTRAST:  6mL GADAVIST GADOBUTROL 1 MMOL/ML IV SOLN  COMPARISON:  Ultrasound and CT 05/02/2023  FINDINGS: Lower chest: No acute pleural or parenchymal lung disease.  Hepatobiliary: Multiple large gallstones are identified within the gallbladder lumen. Mild gallbladder wall thickening measuring 4 mm. No pericholecystic fluid.  0.8 cm cyst within the inferior aspect right lobe liver. Otherwise liver parenchyma is unremarkable.  There is no intrahepatic biliary duct dilation. Cystic duct appears patent. The common bile duct measures up to 5 mm in diameter, with filling defects in the downstream common bile duct measuring up to 4 mm in diameter and extending approximately 2.3 cm in length, consistent with multiple choledocholithiasis.  Pancreas: No mass, inflammatory changes, or other parenchymal abnormality identified.  Spleen:  Within normal limits in size and appearance.  Adrenals/Urinary Tract: No masses identified. No evidence of hydronephrosis.  Stomach/Bowel: Visualized portions within the  abdomen are unremarkable.  Vascular/Lymphatic: No pathologically enlarged lymph nodes identified. No abdominal aortic aneurysm demonstrated.  Other:  No free fluid.  No abdominal wall hernia.  Musculoskeletal: No suspicious bone lesions identified.  IMPRESSION: 1. Cholelithiasis, with borderline gallbladder wall thickening measuring 4 mm. There is no pericholecystic fluid or other inflammatory change. Findings are equivocal for acute cholecystitis. 2. Multiple filling defects within the downstream common bile  duct measuring up to 4 mm in diameter, compatible with choledocholithiasis. 3. No other acute findings.  Electronically Signed   By: Sharlet Salina M.D.   On: 05/02/2023 23:52 CT ABDOMEN PELVIS WO CONTRAST CLINICAL DATA:  Epigastric pain radiating to back, nausea and vomiting for 3 days  EXAM: CT ABDOMEN AND PELVIS WITHOUT CONTRAST  TECHNIQUE: Multidetector CT imaging of the abdomen and pelvis was performed following the standard protocol without IV contrast. Recommend follow up pre and post contrast MRI/MRCP or pancreatic protocol CT in 1 year. This recommendation follows ACR consensus guidelines: Management of Incidental Pancreatic Cysts: A White Paper of the ACR Incidental Findings Committee. J Am Coll Radiol 2017;14:911-923.  RADIATION DOSE REDUCTION: This exam was performed according to the departmental dose-optimization program which includes automated exposure control, adjustment of the mA and/or kV according to patient size and/or use of iterative reconstruction technique.  COMPARISON:  05/02/2023 ultrasound  FINDINGS: Lower chest: No acute pleural or parenchymal lung disease.  Hepatobiliary: Gallbladder is moderately distended, with no evidence of calcified gallstones. Gallbladder wall is thickened measuring up to 5 mm. Please see preceding right upper quadrant ultrasound report describing shadowing gallstones. Unremarkable unenhanced appearance of the liver. No biliary duct dilation.  Pancreas: Unremarkable unenhanced appearance.  Spleen: Unremarkable unenhanced appearance.  Adrenals/Urinary Tract: No urinary tract calculi or obstructive uropathy within either kidney. The adrenals and bladder are unremarkable.  Stomach/Bowel: No bowel obstruction or ileus. Normal appendix right lower quadrant. No bowel wall thickening or inflammatory change. Diverticulum off the gastric fundus.  Vascular/Lymphatic: No significant vascular findings are present. No enlarged  abdominal or pelvic lymph nodes.  Reproductive: Status post hysterectomy. No adnexal masses.  Other: No free fluid or free intraperitoneal gas. No abdominal wall hernia.  Musculoskeletal: No acute or destructive bony abnormalities. Reconstructed images demonstrate no additional findings.  IMPRESSION: 1. Gallbladder wall thickening. The shadowing gallstones seen by ultrasound are not visualized by CT. The appearance of the gallbladder could suggest acute cholecystitis, and correlation with clinical presentation and laboratory evaluation is recommended. 2. No urinary tract calculi or obstructive uropathy. 3. Normal appendix.  Electronically Signed   By: Sharlet Salina M.D.   On: 05/02/2023 20:22 US Abdomen Limited RUQ (LIVER/GB) CLINICAL DATA:  Right upper quadrant pain for 1 day  EXAM: ULTRASOUND ABDOMEN LIMITED RIGHT UPPER QUADRANT  COMPARISON:  None Available.  FINDINGS: Gallbladder:  Multiple shadowing gallstones are identified, largest measuring 1.7 cm in size. Gallbladder wall is thickened measuring up to 6 mm. Negative sonographic Murphy sign. No pericholecystic fluid.  Common bile duct:  Diameter: 5 mm  Liver:  No focal lesion identified. Within normal limits in parenchymal echogenicity. Portal vein is patent on color Doppler imaging with normal direction of blood flow towards the liver.  Other: None.  IMPRESSION: 1. Cholelithiasis, with gallbladder wall thickening measuring up to 6 mm. This is consistent with acute cholecystitis. If further imaging is desired, nuclear medicine hepatobiliary scan could be considered.  Electronically Signed   By: Sharlet Salina M.D.   On: 05/02/2023 20:15    Past Medical History:  Diagnosis Date   Anxiety    Gastritis    GERD (gastroesophageal reflux disease)    Hypertension     Past Surgical History:  Procedure Laterality Date   ABDOMINAL HYSTERECTOMY      Family History  Problem Relation Age of Onset    Breast cancer Sister 66    Prior to Admission medications   Medication Sig Start Date End Date Taking? Authorizing Provider  albuterol (PROVENTIL HFA;VENTOLIN HFA) 108 (90 Base) MCG/ACT inhaler Inhale 1-2 puffs into the lungs every 6 (six) hours as needed for wheezing or shortness of breath.    [provider]  ALPRAZolam Prudy Feeler) 0.5 MG tablet Take 0.5 mg by mouth daily as needed. 03/25/17   [provider]  amLODipine (NORVASC) 5 MG tablet Take 5 mg by mouth daily.    [provider]  Cholecalciferol (VITAMIN D-3) 1000 units CAPS Take 1,000 Units by mouth daily.    [provider]  citalopram (CELEXA) 20 MG tablet Take 20 mg by mouth daily.    [provider]  escitalopram (LEXAPRO) 10 MG tablet Take 10 mg by mouth daily. 02/25/17   [provider]  HYDROcodone bit-homatropine (HYCODAN) 5-1.5 MG/5ML syrup Take by mouth. 05/17/22   [provider]  metoprolol succinate (TOPROL-XL) 25 MG 24 hr tablet Take 1 tablet by mouth daily. 02/22/22 02/22/23  [provider]  Multiple Vitamin (MULTIVITAMIN WITH MINERALS) TABS tablet Take 1 tablet by mouth daily.    [provider]  ondansetron (ZOFRAN-ODT) 4 MG disintegrating tablet Take 1 tablet (4 mg total) by mouth every 8 (eight) hours as needed for nausea or vomiting. 05/17/22   Chesley Noon, MD  pantoprazole (PROTONIX) 40 MG tablet Take 40 mg by mouth daily.    [provider]  Vitamin D, Ergocalciferol, (DRISDOL) 1.25 MG (50000 UNIT) CAPS capsule Take 50,000 Units by mouth 2 (two) times a week. 02/27/22   [provider]  zolpidem (AMBIEN) 5 MG tablet Take 5 mg by mouth at bedtime as needed. 01/30/17   [provider]    Current Facility-Administered Medications  Medication Dose Route Frequency Provider Last Rate Last Admin   acetaminophen (TYLENOL) tablet 650 mg  650 mg Oral Q6H PRN Howerter, Justin B, DO       Or   acetaminophen (TYLENOL)  suppository 650 mg  650 mg Rectal Q6H PRN Howerter, Justin B, DO       [START ON 05/04/2023] amLODipine (NORVASC) tablet 5 mg  5 mg Oral Daily Russella Dar, NP       cefTRIAXone (ROCEPHIN) 2 g in sodium chloride 0.9 % 100 mL IVPB  2 g Intravenous Q24H Russella Dar, NP 200 mL/hr at 05/03/23 0955 2 g at 05/03/23 0955   dextrose 5 % and 0.9 % NaCl with KCl 20 mEq/L infusion   Intravenous Continuous Russella Dar, NP 100 mL/hr at 05/03/23 0953 New Bag at 05/03/23 0953   [START ON 05/04/2023] escitalopram (LEXAPRO) tablet 10 mg  10 mg Oral Daily Russella Dar, NP       HYDROmorphone (DILAUDID) injection 0.5 mg  0.5 mg Intravenous Q2H PRN Howerter, Justin B, DO       melatonin tablet 3 mg  3 mg Oral QHS PRN Howerter, Justin B, DO       metroNIDAZOLE (FLAGYL) IVPB 500 mg  500 mg Intravenous Q12H Russella Dar, NP       naloxone [facility]) injection 0.4 mg  0.4 mg Intravenous PRN  Howerter, Justin B, DO       ondansetron (ZOFRAN) injection 4 mg  4 mg Intravenous Q6H PRN Howerter, Justin B, DO       [START ON 05/04/2023] pantoprazole (PROTONIX) EC tablet 40 mg  40 mg Oral Daily Russella Dar, NP       QUEtiapine (SEROQUEL) tablet 50 mg  50 mg Oral QHS Junious Silk L, NP       sodium chloride flush (NS) 0.9 % injection 3 mL  3 mL Intravenous Q12H Russella Dar, NP        Allergies as of 05/03/2023 - Reviewed 05/03/2023  Allergen Reaction Noted   Latex Hives 04/11/2017   Metoprolol Hives 04/09/2023   Penicillins Hives and Itching 04/20/2015   Ivp dye [iodinated contrast media] Hives and Itching 04/11/2017    Social History   Socioeconomic History   Marital status: Married    Spouse name: Not on file   Number of children: Not on file   Years of education: Not on file   Highest education level: Not on file  Occupational History   Not on file  Tobacco Use   Smoking status: Never   Smokeless tobacco: Never  Substance and Sexual Activity   Alcohol use: No   Drug use: No    Sexual activity: Not on file  Other Topics Concern   Not on file  Social History Narrative   Not on file   Social Drivers of Health   Financial Resource Strain: Patient Declined (09/23/2022)   Received from [facility]   Overall Financial Resource Strain (CARDIA)    Difficulty of Paying Living Expenses: Patient declined  Food Insecurity: Patient Declined (05/03/2023)   Hunger Vital Sign    Worried About Running Out of Food in the Last Year: Patient declined    Ran Out of Food in the Last Year: Patient declined  Transportation Needs: No Transportation Needs (05/03/2023)   PRAPARE - Administrator, Civil Service (Medical): No    Lack of Transportation (Non-Medical): No  Physical Activity: Not on file  Stress: Not on file  Social Connections: Moderately Isolated (05/03/2023)   Social Connection and Isolation Panel [NHANES]    Frequency of Communication with Friends and Family: Once a week    Frequency of Social Gatherings with Friends and Family: Once a week    Attends Religious Services: 1 to 4 times per year    Active Member of [employer] or Organizations: No    Attends Banker Meetings: 1 to 4 times per year    Marital Status: Widowed  Intimate Partner Violence: Not At Risk (05/03/2023)   Humiliation, Afraid, Rape, and Kick questionnaire    Fear of Current or Ex-Partner: No    Emotionally Abused: No    Physically Abused: No    Sexually Abused: No     Code Status   Code Status: Full Code  Review of Systems: All systems reviewed and negative except where noted in HPI.  Physical Exam: Vital signs in last 24 hours: Temp:  [97.5 F (36.4 C)-98 F (36.7 C)] 97.9 F (36.6 C) (04/05 0838) Pulse Rate:  [66-76] 70 (04/05 0837) Resp:  [15-18] 17 (04/05 0837) BP: (110-164)/(70-90) 120/76 (04/05 0837) SpO2:  [96 %-100 %] 99 % (04/05 0837) Weight:  [59.9 kg-64 kg] 64 kg (04/05 0533) Last BM Date : 05/02/23  General:  Pleasant female in  NAD Psych:  Cooperative. Normal mood and affect Eyes: Pupils equal Ears:  Normal auditory acuity Nose: No deformity, discharge or lesions Neck:  Supple, no masses felt Lungs:  Clear to auscultation.  Heart:  Regular rate, regular rhythm.  Abdomen:  Soft, nondistended, mild mid upper tenderness, active bowel sounds, no masses felt Rectal :  Deferred Msk: Symmetrical without gross deformities.  Neurologic:  Alert, oriented, grossly normal neurologically Extremities : No edema Skin:  Intact without significant lesions.    Intake/Output from previous day: No intake/output data recorded. Intake/Output this shift:  No intake/output data recorded.   Willette Cluster, NP-C   05/03/2023, 10:50 AM

## 2023-05-03 NOTE — ED Notes (Signed)
 EMTALA reviewed by this RN.

## 2023-05-03 NOTE — H&P (Signed)
 History and Physical    Patient: Sarah Tapia RSW:[id number] DOB: 28-Feb-1955 DOA: 05/03/2023 DOS: the patient was seen and examined on 05/03/2023 PCP: Dorothey Baseman, MD  Patient coming from: Home  Medical readiness/disposition: 05/07/23.  Anticipate patient will need to undergo multiple procedures during this hospitalization including an ERCP and laparoscopic cholecystectomy.  Will discharge back to home with family.  Chief Complaint: Severe upper abdominal pain  HPI: Sarah Tapia is a 68 y.o. female with medical history significant of anemia, GERD with history of Barrett's esophagitis, dyslipidemia, hypertension, depressive disorder on medications.  Patient presented to the [facility] ER by private vehicle after initially presenting from [facility].  She is reporting epigastric pain that radiated to the back with nausea and vomiting x 3 days.  On date of presentation emesis color was yellow.  Evaluation completed in the ED at [facility] revealed biliary pancreatitis, acute cholecystitis and findings concerning for choledocholithiasis.  Initial lipase was 3921, AST 136, ALT 83 and total bilirubin 2.2, WBC was 12,000 and differential was not obtained, urinalysis was abnormal and concerning for possible UTI.  Unfortunately at [facility] they are unable to perform ERCP so request was made to transfer patient to [facility] for treatment.  Hospitalist service has accepted the patient.  Upon my evaluation of the patient she reports that last week she was having mild epigastric pain that resolved independent of treatment.  This past Tuesday the pain returned and became progressively worse.  She describes the pain as being "terrible" yesterday on Friday and then she developed emesis.  The pain eventually started radiating to the back on both sides and she was having epigastric and bilateral upper quadrant pain.  Review of Systems: As mentioned in the history of present illness. All other systems  reviewed and are negative. Past Medical History:  Diagnosis Date   Anxiety    Gastritis    GERD (gastroesophageal reflux disease)    Hypertension    Past Surgical History:  Procedure Laterality Date   ABDOMINAL HYSTERECTOMY     Social History:  reports that she has never smoked. She has never used smokeless tobacco. She reports that she does not drink alcohol and does not use drugs.  Allergies  Allergen Reactions   Latex Hives   Penicillins Hives and Itching   Ivp Dye [Iodinated Contrast Media] Hives and Itching    Family History  Problem Relation Age of Onset   Breast cancer Sister 52    Prior to Admission medications   Medication Sig Start Date End Date Taking? Authorizing Provider  albuterol (PROVENTIL HFA;VENTOLIN HFA) 108 (90 Base) MCG/ACT inhaler Inhale 1-2 puffs into the lungs every 6 (six) hours as needed for wheezing or shortness of breath.    [provider]  ALPRAZolam Prudy Feeler) 0.5 MG tablet Take 0.5 mg by mouth daily as needed. 03/25/17   [provider]  amLODipine (NORVASC) 5 MG tablet Take 5 mg by mouth daily.    [provider]  Cholecalciferol (VITAMIN D-3) 1000 units CAPS Take 1,000 Units by mouth daily.    [provider]  escitalopram (LEXAPRO) 10 MG tablet Take 10 mg by mouth daily. 02/25/17   [provider]  HYDROcodone bit-homatropine (HYCODAN) 5-1.5 MG/5ML syrup Take by mouth. 05/17/22   [provider]  lurasidone (LATUDA) 20 MG TABS tablet SMARTSIG:1 Tablet(s) By Mouth Every Evening 12/11/21   [provider]  metoprolol succinate (TOPROL-XL) 25 MG 24 hr tablet Take 1 tablet by mouth daily. 02/22/22 02/22/23  [provider]  Multiple Vitamin (MULTIVITAMIN WITH MINERALS) TABS tablet Take 1 tablet by mouth daily.    [provider]  ondansetron (ZOFRAN-ODT) 4 MG disintegrating tablet Take 1 tablet (4 mg total) by mouth every 8 (eight) hours as needed for nausea or vomiting.  05/17/22   Chesley Noon, MD  pantoprazole (PROTONIX) 40 MG tablet Take 40 mg by mouth daily.    [provider]  QUEtiapine (SEROQUEL) 50 MG tablet Take 50 mg by mouth at bedtime. 11/26/21   [provider]  Vitamin D, Ergocalciferol, (DRISDOL) 1.25 MG (50000 UNIT) CAPS capsule Take 50,000 Units by mouth 2 (two) times a week. 02/27/22   [provider]  zolpidem (AMBIEN) 5 MG tablet Take 5 mg by mouth at bedtime as needed. 01/30/17   [provider]    Physical Exam: Vitals:   05/03/23 0503 05/03/23 0533  BP: (!) 164/82   Pulse: 70   Resp: 18   Temp: 97.7 F (36.5 C)   TempSrc: Oral   SpO2: 100%   Weight:  64 kg  Height:  5' 2.99" (1.6 m)   Constitutional: NAD, calm, mildly uncomfortable Respiratory: clear to auscultation bilaterally, no wheezing, no crackles. Normal respiratory effort. No accessory muscle use. RA Cardiovascular: Regular rate and rhythm, no murmurs / rubs / gallops. No extremity edema. 2+ pedal pulses.  Abdomen: Focal tenderness in epigastrium as well as bilateral upper quadrants, no masses palpated. No obvious follow-up labs this morning:  Hepatosplenomegaly. Bowel sounds positive.  Musculoskeletal: no clubbing / cyanosis. No joint deformity upper and lower extremities. Good ROM, no contractures. Normal muscle tone.  Skin: no rashes, lesions, ulcers. No induration Neurologic: CN 2-12 grossly intact. Sensation intact, DTR normal. Strength 5/5 x all 4 extremities.  Psychiatric: Normal judgment and insight. Alert and oriented x 3. Normal mood.    Data Reviewed:  Follow-up labs this a.m.: Sodium 142, potassium 3.4, glucose 97, BUN 7, creatinine 0.78, magnesium 2.1, anion gap is normal, lipase down to 209, AST up to 1095, ALT up to 736, total bilirubin stable at 2.4  White count 5900 with normal differential, hemoglobin 13.4, platelets 241,000; PT 13.8, INR 1.0  MRSA screen negative  Patient has been typed and screened and is B+  blood type  Urine culture has been obtained  Abdominal ultrasound: Cholelithiasis with gallbladder wall thickening consistent with acute cholecystitis.  CT abdomen and pelvis with contrast: Gallbladder wall thickening consistent with acute cholecystitis otherwise no other acute abnormalities.  MRCP: Cholelithiasis with borderline gallbladder wall thickening.  No pericholecystic fluid or other inflammatory change.  Equivocal findings for acute cholecystitis.  Multiple filling defects in the common bile duct downstream measuring up to 4 mm in diameter which is compatible with choledocholithiasis  Assessment and Plan: Acute biliary pancreatitis Acute choledocholithiasis Possible acute cholecystitis Continues to have pain especially with palpation Continue IV Dilaudid as needed Patient unable to take Toradol due to history of severe GERD and Barrett's esophagitis Begin Rocephin and Flagyl IV-currently at risk for ascending cholangitis N.p.o. until evaluated by gastroenterology D5 normal saline with potassium IV fluids Anticipate will initially undergo ERCP and eventual laparoscopic cholecystectomy once pancreatitis and associated inflammation better Lipase has dropped significantly since admission from greater than 3000 to just above 200 Continue to follow LFTs including total bilirubin  Acute hypokalemia IV replacement  Abnormal urinalysis Likely secondary to dehydration but as a precaution have obtained urine culture  Hypertension Norvasc 5 mg daily at home but on hold while n.p.o.  Will utilize as needed IV Apresoline  Dyslipidemia Not on statin prior to arrival  GERD with history of Barrett's esophagitis Continue Protonix-since oral will begin tomorrow 4/6  Depressive disorder Continue Lexapro and Seroquel-since oral will begin tomorrow 4/6    Advance Care Planning:   Code Status: Full Code   VTE prophylaxis: SCDs until can determine timing of likely ERCP  for  Consults: Gastroenterology, general surgery  Family Communication: Daughter at bedside  Severity of Illness: The appropriate patient status for this patient is INPATIENT. Inpatient status is judged to be reasonable and necessary in order to provide the required intensity of service to ensure the patient's safety. The patient's presenting symptoms, physical exam findings, and initial radiographic and laboratory data in the context of their chronic comorbidities is felt to place them at high risk for further clinical deterioration. Furthermore, it is not anticipated that the patient will be medically stable for discharge from the hospital within 2 midnights of admission.   * I certify that at the point of admission it is my clinical judgment that the patient will require inpatient hospital care spanning beyond 2 midnights from the point of admission due to high intensity of service, high risk for further deterioration and high frequency of surveillance required.*  Author: Junious Silk, NP 05/03/2023 6:50 AM  For on call review [email or web address].

## 2023-05-03 NOTE — Consult Note (Addendum)
 Sarah Tapia Jun 25, 1955  [id number].    Requesting MD: Junious Silk Chief Complaint/Reason for Consult: abdominal pain  HPI:  68 yo female with 2 weeks of worsening abdominal pain. Pain is epigastric and was horrible yesterday so she went to the ED. She has had morphine which has helped the pain. She has had nausea as well. ED found stones, transaminitis, and choledocholithiasis so [facility]  ROS: Review of Systems  Constitutional: Negative.   HENT: Negative.    Eyes: Negative.   Respiratory: Negative.    Cardiovascular: Negative.   Gastrointestinal:  Positive for abdominal pain and nausea.  Genitourinary: Negative.   Musculoskeletal: Negative.   Skin: Negative.   Neurological: Negative.   Endo/Heme/Allergies: Negative.   Psychiatric/Behavioral: Negative.      Family History  Problem Relation Age of Onset   Breast cancer Sister 36    Past Medical History:  Diagnosis Date   Anxiety    Gastritis    GERD (gastroesophageal reflux disease)    Hypertension     Past Surgical History:  Procedure Laterality Date   ABDOMINAL HYSTERECTOMY      Social History:  reports that she has never smoked. She has never used smokeless tobacco. She reports that she does not drink alcohol and does not use drugs.  Allergies:  Allergies  Allergen Reactions   Latex Hives   Penicillins Hives and Itching   Ivp Dye [Iodinated Contrast Media] Hives and Itching    Medications Prior to Admission  Medication Sig Dispense Refill   albuterol (PROVENTIL HFA;VENTOLIN HFA) 108 (90 Base) MCG/ACT inhaler Inhale 1-2 puffs into the lungs every 6 (six) hours as needed for wheezing or shortness of breath.     ALPRAZolam (XANAX) 0.5 MG tablet Take 0.5 mg by mouth daily as needed.  2   amLODipine (NORVASC) 5 MG tablet Take 5 mg by mouth daily.     Cholecalciferol (VITAMIN D-3) 1000 units CAPS Take 1,000 Units by mouth daily.     escitalopram (LEXAPRO) 10 MG tablet Take 10 mg by mouth  daily.  0   HYDROcodone bit-homatropine (HYCODAN) 5-1.5 MG/5ML syrup Take by mouth.     lurasidone (LATUDA) 20 MG TABS tablet SMARTSIG:1 Tablet(s) By Mouth Every Evening     metoprolol succinate (TOPROL-XL) 25 MG 24 hr tablet Take 1 tablet by mouth daily.     Multiple Vitamin (MULTIVITAMIN WITH MINERALS) TABS tablet Take 1 tablet by mouth daily.     ondansetron (ZOFRAN-ODT) 4 MG disintegrating tablet Take 1 tablet (4 mg total) by mouth every 8 (eight) hours as needed for nausea or vomiting. 12 tablet 0   pantoprazole (PROTONIX) 40 MG tablet Take 40 mg by mouth daily.     QUEtiapine (SEROQUEL) 50 MG tablet Take 50 mg by mouth at bedtime.     Vitamin D, Ergocalciferol, (DRISDOL) 1.25 MG (50000 UNIT) CAPS capsule Take 50,000 Units by mouth 2 (two) times a week.     zolpidem (AMBIEN) 5 MG tablet Take 5 mg by mouth at bedtime as needed.  0    Physical Exam: Blood pressure (!) 164/82, pulse 70, temperature 97.9 F (36.6 C), temperature source Oral, resp. rate 18, height 5' 2.99" (1.6 m), weight 64 kg, SpO2 100%. Gen: NAD Resp: nonlabored CV: RRR Abd: soft, tender throughout Neuro: Aox4  Results for orders placed or performed during the hospital encounter of 05/03/23 (from the past 48 hours)  Surgical PCR screen     Status: None  Collection Time: 05/03/23  5:22 AM   Specimen: Nasal Mucosa; Nasal Swab  Result Value Ref Range   MRSA, PCR NEGATIVE NEGATIVE   Staphylococcus aureus NEGATIVE NEGATIVE    Comment: (NOTE) The Xpert SA Assay (FDA approved for NASAL specimens in patients 11 years of age and older), is one component of a comprehensive surveillance program. It is not intended to diagnose infection nor to guide or monitor treatment. Performed at [facility], [address], [place], [place] [address]   Type and screen [facility]     Status: None   Collection Time: 05/03/23  5:31 AM  Result Value Ref Range   ABO/RH(D) B POS    Antibody Screen NEG     Sample Expiration      05/06/2023,2359 Performed at [facility], [address], [place], [place] [address]   Lipase, blood     Status: Abnormal   Collection Time: 05/03/23  5:31 AM  Result Value Ref Range   Lipase 209 (H) 11 - 51 U/L    Comment: Performed at [facility], [address], [place], [place] [address]  CBC with Differential/Platelet     Status: None   Collection Time: 05/03/23  5:41 AM  Result Value Ref Range   WBC 5.9 4.0 - 10.5 K/uL   RBC 4.86 3.87 - 5.11 MIL/uL   Hemoglobin 13.4 12.0 - 15.0 g/dL   HCT 91.4 78.2 - 95.6 %   MCV 84.4 80.0 - 100.0 fL   MCH 27.6 26.0 - 34.0 pg   MCHC 32.7 30.0 - 36.0 g/dL   RDW 21.3 08.6 - 57.8 %   Platelets 241 150 - 400 K/uL   nRBC 0.0 0.0 - 0.2 %   Neutrophils Relative % 60 %   Neutro Abs 3.6 1.7 - 7.7 K/uL   Lymphocytes Relative 27 %   Lymphs Abs 1.6 0.7 - 4.0 K/uL   Monocytes Relative 11 %   Monocytes Absolute 0.7 0.1 - 1.0 K/uL   Eosinophils Relative 1 %   Eosinophils Absolute 0.0 0.0 - 0.5 K/uL   Basophils Relative 1 %   Basophils Absolute 0.0 0.0 - 0.1 K/uL   Immature Granulocytes 0 %   Abs Immature Granulocytes 0.02 0.00 - 0.07 K/uL    Comment: Performed at [facility], [address] [place], [place] [address]  Comprehensive metabolic panel with GFR     Status: Abnormal   Collection Time: 05/03/23  5:41 AM  Result Value Ref Range   Sodium 142 135 - 145 mmol/L   Potassium 3.4 (L) 3.5 - 5.1 mmol/L   Chloride 106 98 - 111 mmol/L   CO2 31 22 - 32 mmol/L   Glucose, Bld 97 70 - 99 mg/dL    Comment: Glucose reference range applies only to samples taken after fasting for at least 8 hours.   BUN 7 (L) 8 - 23 mg/dL   Creatinine, Ser 9.52 0.44 - 1.00 mg/dL   Calcium 8.9 8.9 - 84.1 mg/dL   Total Protein 7.1 6.5 - 8.1 g/dL   Albumin 3.5 3.5 - 5.0 g/dL   AST 3,244 (H) 15 - 41 U/L   ALT 736 (H) 0 - 44 U/L   Alkaline Phosphatase 112 38 - 126 U/L   Total Bilirubin 2.4 (H) 0.0 - 1.2 mg/dL   GFR, Estimated  >01 >02 mL/min    Comment: (NOTE) Calculated using the CKD-EPI Creatinine Equation (2021)  Anion gap 5 5 - 15    Comment: Performed at [facility], [address] [place], [place] [address]  Magnesium     Status: None   Collection Time: 05/03/23  5:41 AM  Result Value Ref Range   Magnesium 2.1 1.7 - 2.4 mg/dL    Comment: Performed at [facility], [address], [place], [place] [address]  Protime-INR     Status: None   Collection Time: 05/03/23  5:41 AM  Result Value Ref Range   Prothrombin Time 13.8 11.4 - 15.2 seconds   INR 1.0 0.8 - 1.2    Comment: (NOTE) INR goal varies based on device and disease states. Performed at [facility], [address] [place], [place] [address]   Bilirubin, direct     Status: Abnormal   Collection Time: 05/03/23  5:41 AM  Result Value Ref Range   Bilirubin, Direct 0.5 (H) 0.0 - 0.2 mg/dL    Comment: Performed at [facility], [address] [place], [place] [address]  ABO/Rh     Status: None   Collection Time: 05/03/23  5:57 AM  Result Value Ref Range   ABO/RH(D)      B POS Performed at [facility], [address], [place], [place] [address]    MR ABDOMEN MRCP W WO CONTAST Result Date: 05/02/2023 CLINICAL DATA:  Right upper quadrant and epigastric pain, nausea and vomiting, cholelithiasis and gallbladder wall thickening on earlier imaging EXAM: MRI ABDOMEN WITHOUT AND WITH CONTRAST (INCLUDING MRCP) TECHNIQUE: Multiplanar multisequence MR imaging of the abdomen was performed both before and after the administration of intravenous contrast. Heavily T2-weighted images of the biliary and pancreatic ducts were obtained, and three-dimensional MRCP images were rendered by post processing. CONTRAST:  6mL GADAVIST GADOBUTROL 1 MMOL/ML IV SOLN COMPARISON:  Ultrasound and CT 05/02/2023 FINDINGS: Lower chest: No acute pleural or parenchymal lung disease. Hepatobiliary: Multiple large gallstones are identified within the  gallbladder lumen. Mild gallbladder wall thickening measuring 4 mm. No pericholecystic fluid. 0.8 cm cyst within the inferior aspect right lobe liver. Otherwise liver parenchyma is unremarkable. There is no intrahepatic biliary duct dilation. Cystic duct appears patent. The common bile duct measures up to 5 mm in diameter, with filling defects in the downstream common bile duct measuring up to 4 mm in diameter and extending approximately 2.3 cm in length, consistent with multiple choledocholithiasis. Pancreas: No mass, inflammatory changes, or other parenchymal abnormality identified. Spleen:  Within normal limits in size and appearance. Adrenals/Urinary Tract: No masses identified. No evidence of hydronephrosis. Stomach/Bowel: Visualized portions within the abdomen are unremarkable. Vascular/Lymphatic: No pathologically enlarged lymph nodes identified. No abdominal aortic aneurysm demonstrated. Other:  No free fluid.  No abdominal wall hernia. Musculoskeletal: No suspicious bone lesions identified. IMPRESSION: 1. Cholelithiasis, with borderline gallbladder wall thickening measuring 4 mm. There is no pericholecystic fluid or other inflammatory change. Findings are equivocal for acute cholecystitis. 2. Multiple filling defects within the downstream common bile duct measuring up to 4 mm in diameter, compatible with choledocholithiasis. 3. No other acute findings. Electronically Signed   By: Sharlet Salina M.D.   On: 05/02/2023 23:52   MR 3D Recon At Scanner Result Date: 05/02/2023 CLINICAL DATA:  Right upper quadrant and epigastric pain, nausea and vomiting, cholelithiasis and gallbladder wall thickening on earlier imaging EXAM: MRI ABDOMEN WITHOUT AND WITH CONTRAST (INCLUDING MRCP) TECHNIQUE: Multiplanar multisequence MR imaging of the abdomen was performed both before and after the  administration of intravenous contrast. Heavily T2-weighted images of the biliary and pancreatic ducts were obtained, and  three-dimensional MRCP images were rendered by post processing. CONTRAST:  6mL GADAVIST GADOBUTROL 1 MMOL/ML IV SOLN COMPARISON:  Ultrasound and CT 05/02/2023 FINDINGS: Lower chest: No acute pleural or parenchymal lung disease. Hepatobiliary: Multiple large gallstones are identified within the gallbladder lumen. Mild gallbladder wall thickening measuring 4 mm. No pericholecystic fluid. 0.8 cm cyst within the inferior aspect right lobe liver. Otherwise liver parenchyma is unremarkable. There is no intrahepatic biliary duct dilation. Cystic duct appears patent. The common bile duct measures up to 5 mm in diameter, with filling defects in the downstream common bile duct measuring up to 4 mm in diameter and extending approximately 2.3 cm in length, consistent with multiple choledocholithiasis. Pancreas: No mass, inflammatory changes, or other parenchymal abnormality identified. Spleen:  Within normal limits in size and appearance. Adrenals/Urinary Tract: No masses identified. No evidence of hydronephrosis. Stomach/Bowel: Visualized portions within the abdomen are unremarkable. Vascular/Lymphatic: No pathologically enlarged lymph nodes identified. No abdominal aortic aneurysm demonstrated. Other:  No free fluid.  No abdominal wall hernia. Musculoskeletal: No suspicious bone lesions identified. IMPRESSION: 1. Cholelithiasis, with borderline gallbladder wall thickening measuring 4 mm. There is no pericholecystic fluid or other inflammatory change. Findings are equivocal for acute cholecystitis. 2. Multiple filling defects within the downstream common bile duct measuring up to 4 mm in diameter, compatible with choledocholithiasis. 3. No other acute findings. Electronically Signed   By: Sharlet Salina M.D.   On: 05/02/2023 23:52   CT ABDOMEN PELVIS WO CONTRAST Result Date: 05/02/2023 CLINICAL DATA:  Epigastric pain radiating to back, nausea and vomiting for 3 days EXAM: CT ABDOMEN AND PELVIS WITHOUT CONTRAST TECHNIQUE:  Multidetector CT imaging of the abdomen and pelvis was performed following the standard protocol without IV contrast. Recommend follow up pre and post contrast MRI/MRCP or pancreatic protocol CT in 1 year. This recommendation follows ACR consensus guidelines: Management of Incidental Pancreatic Cysts: A White Paper of the ACR Incidental Findings Committee. J Am Coll Radiol 2017;14:911-923. RADIATION DOSE REDUCTION: This exam was performed according to the departmental dose-optimization program which includes automated exposure control, adjustment of the mA and/or kV according to patient size and/or use of iterative reconstruction technique. COMPARISON:  05/02/2023 ultrasound FINDINGS: Lower chest: No acute pleural or parenchymal lung disease. Hepatobiliary: Gallbladder is moderately distended, with no evidence of calcified gallstones. Gallbladder wall is thickened measuring up to 5 mm. Please see preceding right upper quadrant ultrasound report describing shadowing gallstones. Unremarkable unenhanced appearance of the liver. No biliary duct dilation. Pancreas: Unremarkable unenhanced appearance. Spleen: Unremarkable unenhanced appearance. Adrenals/Urinary Tract: No urinary tract calculi or obstructive uropathy within either kidney. The adrenals and bladder are unremarkable. Stomach/Bowel: No bowel obstruction or ileus. Normal appendix right lower quadrant. No bowel wall thickening or inflammatory change. Diverticulum off the gastric fundus. Vascular/Lymphatic: No significant vascular findings are present. No enlarged abdominal or pelvic lymph nodes. Reproductive: Status post hysterectomy. No adnexal masses. Other: No free fluid or free intraperitoneal gas. No abdominal wall hernia. Musculoskeletal: No acute or destructive bony abnormalities. Reconstructed images demonstrate no additional findings. IMPRESSION: 1. Gallbladder wall thickening. The shadowing gallstones seen by ultrasound are not visualized by CT. The  appearance of the gallbladder could suggest acute cholecystitis, and correlation with clinical presentation and laboratory evaluation is recommended. 2. No urinary tract calculi or obstructive uropathy. 3. Normal appendix. Electronically Signed   By: Sharlet Salina M.D.   On: 05/02/2023 20:22  US Abdomen Limited RUQ (LIVER/GB) Result Date: 05/02/2023 CLINICAL DATA:  Right upper quadrant pain for 1 day EXAM: ULTRASOUND ABDOMEN LIMITED RIGHT UPPER QUADRANT COMPARISON:  None Available. FINDINGS: Gallbladder: Multiple shadowing gallstones are identified, largest measuring 1.7 cm in size. Gallbladder wall is thickened measuring up to 6 mm. Negative sonographic Murphy sign. No pericholecystic fluid. Common bile duct: Diameter: 5 mm Liver: No focal lesion identified. Within normal limits in parenchymal echogenicity. Portal vein is patent on color Doppler imaging with normal direction of blood flow towards the liver. Other: None. IMPRESSION: 1. Cholelithiasis, with gallbladder wall thickening measuring up to 6 mm. This is consistent with acute cholecystitis. If further imaging is desired, nuclear medicine hepatobiliary scan could be considered. Electronically Signed   By: Sharlet Salina M.D.   On: 05/02/2023 20:15    Assessment/Plan 68 yo female with choledocholithiasis on MRCP and inflammatory changes in gallbladder  I reviewed MRI images and [place] images showing lots of gallbladder stones, thickening of the galllbladder and stones/filling defects within the mid and distal CBD. Normal WBC, AST/ALT both above 700 concerning for hepatic cell death. Total bilirubin 2.4 lower than I would have expected -NPO -IV abx -GI consult pending -I discussed tentative option of ERCP/choledocholithiasis treatment and likely preventive cholecystectomy prior to hospital discharge   FEN - NPO VTE - SCDs ID - [email or web address] Admit - inpatient    I reviewed last 24 h vitals and pain scores, last 48 h intake and output, last 24  h labs and trends, and last 24 h imaging results.  [facility] Surgery 05/03/2023, 10:05 AM Please see Amion for pager number during day hours 7:00am-4:30pm or 7:00am -11:30am on weekends

## 2023-05-03 NOTE — ED Notes (Signed)
 Called [facility] spoke with Selena Batten, rep. stated the delay is that other people have been added with higher priority and it there are no trucks available right now, but pt. is still on the list and will be picked up ASAP.

## 2023-05-03 NOTE — ED Notes (Signed)
 Handoff given to transport, and vitals were taken within 15 minutes of departure, however, as patient was being wheeled out, EMS asked that patient be medicated for pain prior to leaving, delaying transportation time

## 2023-05-03 NOTE — H&P (View-Only) (Signed)
 Consultation Note   Referring Provider:  [facility] PCP: Dorothey Baseman, MD Primary Gastroenterologist:: Eather Colas at  [facility]     Reason for Consultation:  CBD stones DOA: 05/03/2023         Hospital Day: 1   ASSESSMENT    67 y.o. year old female with a history of collagenous colitis, reported Barrett's esophagus, chronic diastolic heart failure, B12 deficiency  Cholelithiasis / choledocholithiasis / gallstone pancreatitis ( chemical) and possible acute cholecystitis Marked elevation in liver enzymes overnight. Pain improved ( without analgesics). HCT 41%  Mild leukocytosis  Resolved on antibiotics.    Suspected UTI.   GERD with reported Barrett's esophagus.  Asymptomatic on daily Pantoprazole.   ? History of Heller myotomy per patient (I don't see anything about this in record)   See PMH for additional history    PLAN:   --Getting Rocephin and Flagyl -- IV fluids going at 100 mL an hour.  Unable to determine if / how much fluid bolus that she got in the ED but Hct 41%  --Will need ERCP, ? Possibly tomorrow. The benefits and risks of ERCP with possible sphincterotomy not limited to cardiopulmonary complications of sedation, bleeding, infection, perforation,and pancreatitis were discussed with the patient who agrees to proceed.   -- General Surgery evaluated this a.m..  Will get cholecystectomy following removal of common bile duct stones  HPI   Patient presented to the ED at [facility] yesterday for evaluation of epigastric pain, nausea and vomiting.  In the ED her WBC was mildly elevated . LFTs were elevated, lipase was 3900.  UA suspicious for infection.  Imaging,  including MRCP was remarkable for cholelithiasis with choledocholithiasis and possibly acute cholecystitis.  Despite elevated lipase there was no radiologic findings of pancreatitis.  Patient started on IV antibiotics and transferred to [facility] for  ERCP.  RUQ ultrasound 1. Cholelithiasis, with gallbladder wall thickening measuring up to 6 mm. This is consistent with acute cholecystitis.    MRCP Cholelithiasis, with borderline gallbladder wall thickening measuring 4 mm. There is no pericholecystic fluid or other inflammatory change. Findings are equivocal for acute cholecystitis. 2. Multiple filling defects within the downstream common bile duct measuring up to 4 mm in diameter, compatible with choledocholithiasis. 3. No other acute findings.  Today:  Patient is afebrile.  WBC has normalized.  Hematocrit 41%.  Renal function normal.  Overnight she has had a marked increase in her transaminases.  Bilirubin up slightly overnight from 2.2 to 2.4.  Alk phos remains normal.    Previous GI Studies    Reportedly had a normal colonoscopy one year ago - [facility]   Labs and Imaging:  Recent Labs    05/02/23 1658 05/03/23 0541  WBC 12.0* 5.9  HGB 13.3 13.4  HCT 40.8 41.0  MCV 85.9 84.4  PLT 281 241   Recent Labs    05/02/23 1658 05/03/23 0541  NA 138 142  K 4.7 3.4*  CL 101 106  CO2 29 31  GLUCOSE 92 97  BUN 17 7*  CREATININE 0.82 0.78  CALCIUM 9.3 8.9   Recent Labs    05/02/23 1658 05/03/23 0541  PROT 7.4 7.1  ALBUMIN 3.9 3.5  AST 136* 1,085*  ALT 83* 736*  ALKPHOS 71 112  BILITOT 2.2* 2.4*  BILIDIR  --  0.5*   Recent Labs    05/03/23 0541  INR 1.0   No results for input(s): "AFPTUMOR" in the last 72 hours.  MR 3D Recon At Scanner CLINICAL DATA:  Right upper quadrant and epigastric pain, nausea and vomiting, cholelithiasis and gallbladder wall thickening on earlier imaging  EXAM: MRI ABDOMEN WITHOUT AND WITH CONTRAST (INCLUDING MRCP)  TECHNIQUE: Multiplanar multisequence MR imaging of the abdomen was performed both before and after the administration of intravenous contrast. Heavily T2-weighted images of the biliary and pancreatic ducts were obtained, and three-dimensional MRCP images were  rendered by post processing.  CONTRAST:  6mL GADAVIST GADOBUTROL 1 MMOL/ML IV SOLN  COMPARISON:  Ultrasound and CT 05/02/2023  FINDINGS: Lower chest: No acute pleural or parenchymal lung disease.  Hepatobiliary: Multiple large gallstones are identified within the gallbladder lumen. Mild gallbladder wall thickening measuring 4 mm. No pericholecystic fluid.  0.8 cm cyst within the inferior aspect right lobe liver. Otherwise liver parenchyma is unremarkable.  There is no intrahepatic biliary duct dilation. Cystic duct appears patent. The common bile duct measures up to 5 mm in diameter, with filling defects in the downstream common bile duct measuring up to 4 mm in diameter and extending approximately 2.3 cm in length, consistent with multiple choledocholithiasis.  Pancreas: No mass, inflammatory changes, or other parenchymal abnormality identified.  Spleen:  Within normal limits in size and appearance.  Adrenals/Urinary Tract: No masses identified. No evidence of hydronephrosis.  Stomach/Bowel: Visualized portions within the abdomen are unremarkable.  Vascular/Lymphatic: No pathologically enlarged lymph nodes identified. No abdominal aortic aneurysm demonstrated.  Other:  No free fluid.  No abdominal wall hernia.  Musculoskeletal: No suspicious bone lesions identified.  IMPRESSION: 1. Cholelithiasis, with borderline gallbladder wall thickening measuring 4 mm. There is no pericholecystic fluid or other inflammatory change. Findings are equivocal for acute cholecystitis. 2. Multiple filling defects within the downstream common bile duct measuring up to 4 mm in diameter, compatible with choledocholithiasis. 3. No other acute findings.  Electronically Signed   By: Sharlet Salina M.D.   On: 05/02/2023 23:52 MR ABDOMEN MRCP W WO CONTAST CLINICAL DATA:  Right upper quadrant and epigastric pain, nausea and vomiting, cholelithiasis and gallbladder wall thickening on  earlier imaging  EXAM: MRI ABDOMEN WITHOUT AND WITH CONTRAST (INCLUDING MRCP)  TECHNIQUE: Multiplanar multisequence MR imaging of the abdomen was performed both before and after the administration of intravenous contrast. Heavily T2-weighted images of the biliary and pancreatic ducts were obtained, and three-dimensional MRCP images were rendered by post processing.  CONTRAST:  6mL GADAVIST GADOBUTROL 1 MMOL/ML IV SOLN  COMPARISON:  Ultrasound and CT 05/02/2023  FINDINGS: Lower chest: No acute pleural or parenchymal lung disease.  Hepatobiliary: Multiple large gallstones are identified within the gallbladder lumen. Mild gallbladder wall thickening measuring 4 mm. No pericholecystic fluid.  0.8 cm cyst within the inferior aspect right lobe liver. Otherwise liver parenchyma is unremarkable.  There is no intrahepatic biliary duct dilation. Cystic duct appears patent. The common bile duct measures up to 5 mm in diameter, with filling defects in the downstream common bile duct measuring up to 4 mm in diameter and extending approximately 2.3 cm in length, consistent with multiple choledocholithiasis.  Pancreas: No mass, inflammatory changes, or other parenchymal abnormality identified.  Spleen:  Within normal limits in size and appearance.  Adrenals/Urinary Tract: No masses identified. No evidence of hydronephrosis.  Stomach/Bowel: Visualized portions within the  abdomen are unremarkable.  Vascular/Lymphatic: No pathologically enlarged lymph nodes identified. No abdominal aortic aneurysm demonstrated.  Other:  No free fluid.  No abdominal wall hernia.  Musculoskeletal: No suspicious bone lesions identified.  IMPRESSION: 1. Cholelithiasis, with borderline gallbladder wall thickening measuring 4 mm. There is no pericholecystic fluid or other inflammatory change. Findings are equivocal for acute cholecystitis. 2. Multiple filling defects within the downstream common bile  duct measuring up to 4 mm in diameter, compatible with choledocholithiasis. 3. No other acute findings.  Electronically Signed   By: Sharlet Salina M.D.   On: 05/02/2023 23:52 CT ABDOMEN PELVIS WO CONTRAST CLINICAL DATA:  Epigastric pain radiating to back, nausea and vomiting for 3 days  EXAM: CT ABDOMEN AND PELVIS WITHOUT CONTRAST  TECHNIQUE: Multidetector CT imaging of the abdomen and pelvis was performed following the standard protocol without IV contrast. Recommend follow up pre and post contrast MRI/MRCP or pancreatic protocol CT in 1 year. This recommendation follows ACR consensus guidelines: Management of Incidental Pancreatic Cysts: A White Paper of the ACR Incidental Findings Committee. J Am Coll Radiol 2017;14:911-923.  RADIATION DOSE REDUCTION: This exam was performed according to the departmental dose-optimization program which includes automated exposure control, adjustment of the mA and/or kV according to patient size and/or use of iterative reconstruction technique.  COMPARISON:  05/02/2023 ultrasound  FINDINGS: Lower chest: No acute pleural or parenchymal lung disease.  Hepatobiliary: Gallbladder is moderately distended, with no evidence of calcified gallstones. Gallbladder wall is thickened measuring up to 5 mm. Please see preceding right upper quadrant ultrasound report describing shadowing gallstones. Unremarkable unenhanced appearance of the liver. No biliary duct dilation.  Pancreas: Unremarkable unenhanced appearance.  Spleen: Unremarkable unenhanced appearance.  Adrenals/Urinary Tract: No urinary tract calculi or obstructive uropathy within either kidney. The adrenals and bladder are unremarkable.  Stomach/Bowel: No bowel obstruction or ileus. Normal appendix right lower quadrant. No bowel wall thickening or inflammatory change. Diverticulum off the gastric fundus.  Vascular/Lymphatic: No significant vascular findings are present. No enlarged  abdominal or pelvic lymph nodes.  Reproductive: Status post hysterectomy. No adnexal masses.  Other: No free fluid or free intraperitoneal gas. No abdominal wall hernia.  Musculoskeletal: No acute or destructive bony abnormalities. Reconstructed images demonstrate no additional findings.  IMPRESSION: 1. Gallbladder wall thickening. The shadowing gallstones seen by ultrasound are not visualized by CT. The appearance of the gallbladder could suggest acute cholecystitis, and correlation with clinical presentation and laboratory evaluation is recommended. 2. No urinary tract calculi or obstructive uropathy. 3. Normal appendix.  Electronically Signed   By: Sharlet Salina M.D.   On: 05/02/2023 20:22 US Abdomen Limited RUQ (LIVER/GB) CLINICAL DATA:  Right upper quadrant pain for 1 day  EXAM: ULTRASOUND ABDOMEN LIMITED RIGHT UPPER QUADRANT  COMPARISON:  None Available.  FINDINGS: Gallbladder:  Multiple shadowing gallstones are identified, largest measuring 1.7 cm in size. Gallbladder wall is thickened measuring up to 6 mm. Negative sonographic Murphy sign. No pericholecystic fluid.  Common bile duct:  Diameter: 5 mm  Liver:  No focal lesion identified. Within normal limits in parenchymal echogenicity. Portal vein is patent on color Doppler imaging with normal direction of blood flow towards the liver.  Other: None.  IMPRESSION: 1. Cholelithiasis, with gallbladder wall thickening measuring up to 6 mm. This is consistent with acute cholecystitis. If further imaging is desired, nuclear medicine hepatobiliary scan could be considered.  Electronically Signed   By: Sharlet Salina M.D.   On: 05/02/2023 20:15    Past Medical History:  Diagnosis Date   Anxiety    Gastritis    GERD (gastroesophageal reflux disease)    Hypertension     Past Surgical History:  Procedure Laterality Date   ABDOMINAL HYSTERECTOMY      Family History  Problem Relation Age of Onset    Breast cancer Sister 66    Prior to Admission medications   Medication Sig Start Date End Date Taking? Authorizing Provider  albuterol (PROVENTIL HFA;VENTOLIN HFA) 108 (90 Base) MCG/ACT inhaler Inhale 1-2 puffs into the lungs every 6 (six) hours as needed for wheezing or shortness of breath.    [provider]  ALPRAZolam Prudy Feeler) 0.5 MG tablet Take 0.5 mg by mouth daily as needed. 03/25/17   [provider]  amLODipine (NORVASC) 5 MG tablet Take 5 mg by mouth daily.    [provider]  Cholecalciferol (VITAMIN D-3) 1000 units CAPS Take 1,000 Units by mouth daily.    [provider]  citalopram (CELEXA) 20 MG tablet Take 20 mg by mouth daily.    [provider]  escitalopram (LEXAPRO) 10 MG tablet Take 10 mg by mouth daily. 02/25/17   [provider]  HYDROcodone bit-homatropine (HYCODAN) 5-1.5 MG/5ML syrup Take by mouth. 05/17/22   [provider]  metoprolol succinate (TOPROL-XL) 25 MG 24 hr tablet Take 1 tablet by mouth daily. 02/22/22 02/22/23  [provider]  Multiple Vitamin (MULTIVITAMIN WITH MINERALS) TABS tablet Take 1 tablet by mouth daily.    [provider]  ondansetron (ZOFRAN-ODT) 4 MG disintegrating tablet Take 1 tablet (4 mg total) by mouth every 8 (eight) hours as needed for nausea or vomiting. 05/17/22   Chesley Noon, MD  pantoprazole (PROTONIX) 40 MG tablet Take 40 mg by mouth daily.    [provider]  Vitamin D, Ergocalciferol, (DRISDOL) 1.25 MG (50000 UNIT) CAPS capsule Take 50,000 Units by mouth 2 (two) times a week. 02/27/22   [provider]  zolpidem (AMBIEN) 5 MG tablet Take 5 mg by mouth at bedtime as needed. 01/30/17   [provider]    Current Facility-Administered Medications  Medication Dose Route Frequency Provider Last Rate Last Admin   acetaminophen (TYLENOL) tablet 650 mg  650 mg Oral Q6H PRN Howerter, Justin B, DO       Or   acetaminophen (TYLENOL)  suppository 650 mg  650 mg Rectal Q6H PRN Howerter, Justin B, DO       [START ON 05/04/2023] amLODipine (NORVASC) tablet 5 mg  5 mg Oral Daily Russella Dar, NP       cefTRIAXone (ROCEPHIN) 2 g in sodium chloride 0.9 % 100 mL IVPB  2 g Intravenous Q24H Russella Dar, NP 200 mL/hr at 05/03/23 0955 2 g at 05/03/23 0955   dextrose 5 % and 0.9 % NaCl with KCl 20 mEq/L infusion   Intravenous Continuous Russella Dar, NP 100 mL/hr at 05/03/23 0953 New Bag at 05/03/23 0953   [START ON 05/04/2023] escitalopram (LEXAPRO) tablet 10 mg  10 mg Oral Daily Russella Dar, NP       HYDROmorphone (DILAUDID) injection 0.5 mg  0.5 mg Intravenous Q2H PRN Howerter, Justin B, DO       melatonin tablet 3 mg  3 mg Oral QHS PRN Howerter, Justin B, DO       metroNIDAZOLE (FLAGYL) IVPB 500 mg  500 mg Intravenous Q12H Russella Dar, NP       naloxone [facility]) injection 0.4 mg  0.4 mg Intravenous PRN  Howerter, Justin B, DO       ondansetron (ZOFRAN) injection 4 mg  4 mg Intravenous Q6H PRN Howerter, Justin B, DO       [START ON 05/04/2023] pantoprazole (PROTONIX) EC tablet 40 mg  40 mg Oral Daily Russella Dar, NP       QUEtiapine (SEROQUEL) tablet 50 mg  50 mg Oral QHS Junious Silk L, NP       sodium chloride flush (NS) 0.9 % injection 3 mL  3 mL Intravenous Q12H Russella Dar, NP        Allergies as of 05/03/2023 - Reviewed 05/03/2023  Allergen Reaction Noted   Latex Hives 04/11/2017   Metoprolol Hives 04/09/2023   Penicillins Hives and Itching 04/20/2015   Ivp dye [iodinated contrast media] Hives and Itching 04/11/2017    Social History   Socioeconomic History   Marital status: Married    Spouse name: Not on file   Number of children: Not on file   Years of education: Not on file   Highest education level: Not on file  Occupational History   Not on file  Tobacco Use   Smoking status: Never   Smokeless tobacco: Never  Substance and Sexual Activity   Alcohol use: No   Drug use: No    Sexual activity: Not on file  Other Topics Concern   Not on file  Social History Narrative   Not on file   Social Drivers of Health   Financial Resource Strain: Patient Declined (09/23/2022)   Received from [facility]   Overall Financial Resource Strain (CARDIA)    Difficulty of Paying Living Expenses: Patient declined  Food Insecurity: Patient Declined (05/03/2023)   Hunger Vital Sign    Worried About Running Out of Food in the Last Year: Patient declined    Ran Out of Food in the Last Year: Patient declined  Transportation Needs: No Transportation Needs (05/03/2023)   PRAPARE - Administrator, Civil Service (Medical): No    Lack of Transportation (Non-Medical): No  Physical Activity: Not on file  Stress: Not on file  Social Connections: Moderately Isolated (05/03/2023)   Social Connection and Isolation Panel [NHANES]    Frequency of Communication with Friends and Family: Once a week    Frequency of Social Gatherings with Friends and Family: Once a week    Attends Religious Services: 1 to 4 times per year    Active Member of [employer] or Organizations: No    Attends Banker Meetings: 1 to 4 times per year    Marital Status: Widowed  Intimate Partner Violence: Not At Risk (05/03/2023)   Humiliation, Afraid, Rape, and Kick questionnaire    Fear of Current or Ex-Partner: No    Emotionally Abused: No    Physically Abused: No    Sexually Abused: No     Code Status   Code Status: Full Code  Review of Systems: All systems reviewed and negative except where noted in HPI.  Physical Exam: Vital signs in last 24 hours: Temp:  [97.5 F (36.4 C)-98 F (36.7 C)] 97.9 F (36.6 C) (04/05 0838) Pulse Rate:  [66-76] 70 (04/05 0837) Resp:  [15-18] 17 (04/05 0837) BP: (110-164)/(70-90) 120/76 (04/05 0837) SpO2:  [96 %-100 %] 99 % (04/05 0837) Weight:  [59.9 kg-64 kg] 64 kg (04/05 0533) Last BM Date : 05/02/23  General:  Pleasant female in  NAD Psych:  Cooperative. Normal mood and affect Eyes: Pupils equal Ears:  Normal auditory acuity Nose: No deformity, discharge or lesions Neck:  Supple, no masses felt Lungs:  Clear to auscultation.  Heart:  Regular rate, regular rhythm.  Abdomen:  Soft, nondistended, mild mid upper tenderness, active bowel sounds, no masses felt Rectal :  Deferred Msk: Symmetrical without gross deformities.  Neurologic:  Alert, oriented, grossly normal neurologically Extremities : No edema Skin:  Intact without significant lesions.    Intake/Output from previous day: No intake/output data recorded. Intake/Output this shift:  No intake/output data recorded.   Willette Cluster, NP-C   05/03/2023, 10:50 AM

## 2023-05-03 NOTE — ED Notes (Signed)
 Per Rep Asher Muir pt accepted to [phone]. # for report [phone].  Rep stated that a truck will be here to get the pt in 40 minutes.

## 2023-05-03 NOTE — Progress Notes (Signed)
(  Carryover admission to the Day Admitter; accepted by Dr.  Antionette Char as transfer from  [facility] ED  to a  med-tele bed at  [facility]  for choledocholithiasis, as [facility] does not currently have ERCP capabilities. Please see Dr.  Francesco Runner transfer progress note for additional details).   Briefly , this is a 68 year old female with history of chronic diastolic heart failure, is a is admitted to [facility] as a transfer from [facility] ED with choledocholithiasis observed on MRCP.    I have placed some additional preliminary admit orders via the adult multi-morbid admission order set. I have also ordered updated labs in the form of CMP, CBC, magnesium level INR, direct bilirubin level.  I have also ordered type and screen, prn IV Dilaudid, prn IV Zofran, and have kept the patient npo.    Newton Pigg, DO Hospitalist

## 2023-05-03 NOTE — Progress Notes (Signed)
 Plan of Care Note for accepted transfer   Patient: Sarah Tapia MRN: [id number]   DOA: 05/02/2023  Facility requesting transfer: [facility]  Requesting Provider: Dr. York Cerise   Reason for transfer: Choledocholithiasis   Facility course: 68 yr old female with HTN, depression, anxiety, SVT, and Barrett esophagus presents with 3 days of upper abdominal pain and N/V.   She is found to have lipase 3921, AST 136, ALT 83, and t bili 2.2. [place] and CT are concerning for acute cholecystitis.   There was concern that the patient may need ERCP which is not available at [facility] currently, and so Dr. Doy Hutching of [facility] in [place] was consulted. She recommended MRCP first, noting that patient could be managed at [facility] if negative, but would need to be transferred for ERCP if positive.   MRCP demonstrates filling defects in the CBD compatible with choledocholithiasis.    Plan of care: The patient is accepted for admission to Telemetry unit, at [facility].   Author: Briscoe Deutscher, MD 05/03/2023  Check [email or web address] for on-call coverage.  Nursing staff, Please call [facility] Admits & Consults System-Wide number on Amion as soon as patient's arrival, so appropriate admitting provider can evaluate the pt.

## 2023-05-03 NOTE — ED Notes (Signed)
 Called [facility] for a follow-up spoke with Rep Asher Muir, she is paging the hospitalist and will give [place] a call back.

## 2023-05-04 ENCOUNTER — Inpatient Hospital Stay (HOSPITAL_COMMUNITY): Admitting: Certified Registered Nurse Anesthetist

## 2023-05-04 ENCOUNTER — Encounter (HOSPITAL_COMMUNITY)
Admission: EM | Disposition: A | Discharge status: Home / Self Care | Payer: Self-pay | Source: Other Acute Inpatient Hospital | Attending: Internal Medicine

## 2023-05-04 ENCOUNTER — Inpatient Hospital Stay (HOSPITAL_COMMUNITY)

## 2023-05-04 ENCOUNTER — Encounter (HOSPITAL_COMMUNITY): Payer: Self-pay | Admitting: Internal Medicine

## 2023-05-04 DIAGNOSIS — K5710 Diverticulosis of small intestine without perforation or abscess without bleeding: Secondary | ICD-10-CM

## 2023-05-04 DIAGNOSIS — K838 Other specified diseases of biliary tract: Secondary | ICD-10-CM

## 2023-05-04 DIAGNOSIS — K8020 Calculus of gallbladder without cholecystitis without obstruction: Secondary | ICD-10-CM

## 2023-05-04 DIAGNOSIS — I10 Essential (primary) hypertension: Secondary | ICD-10-CM

## 2023-05-04 DIAGNOSIS — K8050 Calculus of bile duct without cholangitis or cholecystitis without obstruction: Secondary | ICD-10-CM

## 2023-05-04 DIAGNOSIS — F418 Other specified anxiety disorders: Secondary | ICD-10-CM | POA: Diagnosis not present

## 2023-05-04 DIAGNOSIS — K828 Other specified diseases of gallbladder: Secondary | ICD-10-CM

## 2023-05-04 DIAGNOSIS — J449 Chronic obstructive pulmonary disease, unspecified: Secondary | ICD-10-CM

## 2023-05-04 HISTORY — PX: ERCP: SHX5425

## 2023-05-04 HISTORY — PX: STONE EXTRACTION WITH BASKET: SHX5318

## 2023-05-04 LAB — CBC
HCT: 40.9 % (ref 36.0–46.0)
Hemoglobin: 12.9 g/dL (ref 12.0–15.0)
MCH: 27.2 pg (ref 26.0–34.0)
MCHC: 31.5 g/dL (ref 30.0–36.0)
MCV: 86.3 fL (ref 80.0–100.0)
Platelets: 234 10*3/uL (ref 150–400)
RBC: 4.74 MIL/uL (ref 3.87–5.11)
RDW: 13.2 % (ref 11.5–15.5)
WBC: 5.4 10*3/uL (ref 4.0–10.5)
nRBC: 0 % (ref 0.0–0.2)

## 2023-05-04 LAB — COMPREHENSIVE METABOLIC PANEL WITH GFR
ALT: 485 U/L — ABNORMAL HIGH (ref 0–44)
AST: 342 U/L — ABNORMAL HIGH (ref 15–41)
Albumin: 3.5 g/dL (ref 3.5–5.0)
Alkaline Phosphatase: 107 U/L (ref 38–126)
Anion gap: 10 (ref 5–15)
BUN: <5 mg/dL — ABNORMAL LOW (ref 8–23)
CO2: 26 mmol/L (ref 22–32)
Calcium: 8.5 mg/dL — ABNORMAL LOW (ref 8.9–10.3)
Chloride: 103 mmol/L (ref 98–111)
Creatinine, Ser: 0.66 mg/dL (ref 0.44–1.00)
GFR, Estimated: >60 mL/min (ref >60)
Glucose, Bld: 125 mg/dL — ABNORMAL HIGH (ref 70–99)
Potassium: 3.6 mmol/L (ref 3.5–5.1)
Sodium: 139 mmol/L (ref 135–145)
Total Bilirubin: 1.3 mg/dL — ABNORMAL HIGH (ref 0.0–1.2)
Total Protein: 7 g/dL (ref 6.5–8.1)

## 2023-05-04 LAB — LIPASE, BLOOD: Lipase: 43 U/L (ref 11–51)

## 2023-05-04 LAB — URINE CULTURE: Culture: <10000 — AB

## 2023-05-04 SURGERY — ERCP, WITH INTERVENTION IF INDICATED
Anesthesia: General

## 2023-05-04 MED ORDER — INDOMETHACIN 50 MG RE SUPP
RECTAL | Status: DC | PRN
  Administered 2023-05-04: 100 mg via RECTAL

## 2023-05-04 MED ORDER — PHENYLEPHRINE 80 MCG/ML (10ML) SYRINGE FOR IV PUSH (FOR BLOOD PRESSURE SUPPORT)
PREFILLED_SYRINGE | INTRAVENOUS | Status: DC | PRN
  Administered 2023-05-04: 80 ug via INTRAVENOUS

## 2023-05-04 MED ORDER — SUGAMMADEX SODIUM 200 MG/2ML IV SOLN
INTRAVENOUS | Status: DC | PRN
  Administered 2023-05-04: 150 mg via INTRAVENOUS

## 2023-05-04 MED ORDER — MEPERIDINE HCL 25 MG/ML IJ SOLN
6.2500 mg | INTRAMUSCULAR | Status: DC | PRN
  Filled 2023-05-04: qty 1

## 2023-05-04 MED ORDER — CIPROFLOXACIN IN D5W 400 MG/200ML IV SOLN
INTRAVENOUS | Status: AC
  Filled 2023-05-04: qty 200

## 2023-05-04 MED ORDER — DICLOFENAC SUPPOSITORY 100 MG
RECTAL | Status: AC
  Filled 2023-05-04: qty 1

## 2023-05-04 MED ORDER — ROCURONIUM BROMIDE 10 MG/ML (PF) SYRINGE
PREFILLED_SYRINGE | INTRAVENOUS | Status: DC | PRN
  Administered 2023-05-04: 60 mg via INTRAVENOUS

## 2023-05-04 MED ORDER — MIDAZOLAM HCL 2 MG/2ML IJ SOLN
INTRAMUSCULAR | Status: DC | PRN
  Administered 2023-05-04: 2 mg via INTRAVENOUS

## 2023-05-04 MED ORDER — ONDANSETRON HCL 4 MG/2ML IJ SOLN
INTRAMUSCULAR | Status: DC | PRN
  Administered 2023-05-04: 4 mg via INTRAVENOUS

## 2023-05-04 MED ORDER — METHYLPREDNISOLONE SODIUM SUCC 40 MG IJ SOLR
40.0000 mg | Freq: Once | INTRAMUSCULAR | Status: AC
  Administered 2023-05-04: 40 mg via INTRAVENOUS
  Filled 2023-05-04: qty 1

## 2023-05-04 MED ORDER — FENTANYL CITRATE (PF) 250 MCG/5ML IJ SOLN
INTRAMUSCULAR | Status: DC | PRN
Start: 2023-05-04 — End: 2023-05-04
  Administered 2023-05-04 (×2): 50 ug via INTRAVENOUS

## 2023-05-04 MED ORDER — OXYCODONE HCL 5 MG PO TABS
5.0000 mg | ORAL_TABLET | Freq: Once | ORAL | Status: DC | PRN
  Filled 2023-05-04: qty 1

## 2023-05-04 MED ORDER — FENTANYL CITRATE (PF) 100 MCG/2ML IJ SOLN: 25.0000 ug | INTRAMUSCULAR | Status: DC | PRN

## 2023-05-04 MED ORDER — LIDOCAINE 2% (20 MG/ML) 5 ML SYRINGE
INTRAMUSCULAR | Status: DC | PRN
  Administered 2023-05-04: 40 mg via INTRAVENOUS

## 2023-05-04 MED ORDER — INDOMETHACIN 50 MG RE SUPP
RECTAL | Status: AC
  Filled 2023-05-04: qty 2

## 2023-05-04 MED ORDER — SODIUM CHLORIDE 0.9 % IV SOLN
INTRAVENOUS | Status: DC | PRN
  Administered 2023-05-04: 40 mL

## 2023-05-04 MED ORDER — OXYCODONE HCL 5 MG/5ML PO SOLN
5.0000 mg | Freq: Once | ORAL | Status: DC | PRN
  Filled 2023-05-04: qty 5

## 2023-05-04 MED ORDER — GLUCAGON HCL RDNA (DIAGNOSTIC) 1 MG IJ SOLR
INTRAMUSCULAR | Status: AC
  Filled 2023-05-04: qty 1

## 2023-05-04 MED ORDER — DIPHENHYDRAMINE HCL 25 MG PO CAPS: 50.0000 mg | ORAL_CAPSULE | Freq: Once | ORAL | Status: AC

## 2023-05-04 MED ORDER — GLUCAGON HCL RDNA (DIAGNOSTIC) 1 MG IJ SOLR
INTRAMUSCULAR | Status: DC | PRN
Start: 2023-05-04 — End: 2023-05-04
  Administered 2023-05-04 (×4): .25 mg via INTRAVENOUS

## 2023-05-04 MED ORDER — PROPOFOL 10 MG/ML IV BOLUS
INTRAVENOUS | Status: DC | PRN
  Administered 2023-05-04: 120 mg via INTRAVENOUS

## 2023-05-04 MED ORDER — MIDAZOLAM HCL 2 MG/2ML IJ SOLN
INTRAMUSCULAR | Status: AC
  Filled 2023-05-04: qty 2

## 2023-05-04 MED ORDER — LACTATED RINGERS IV SOLN
INTRAVENOUS | Status: DC
Start: 2023-05-04 — End: 2023-05-04

## 2023-05-04 MED ORDER — DEXAMETHASONE SODIUM PHOSPHATE 10 MG/ML IJ SOLN
INTRAMUSCULAR | Status: DC | PRN
  Administered 2023-05-04: 10 mg via INTRAVENOUS

## 2023-05-04 MED ORDER — DIPHENHYDRAMINE HCL 50 MG/ML IJ SOLN
50.0000 mg | Freq: Once | INTRAMUSCULAR | Status: AC
  Administered 2023-05-04: 50 mg via INTRAVENOUS
  Filled 2023-05-04: qty 1

## 2023-05-04 MED ORDER — INDOMETHACIN 50 MG RE SUPP: 100.0000 mg | Freq: Once | RECTAL | Status: DC

## 2023-05-04 MED ORDER — MIDAZOLAM HCL 2 MG/2ML IJ SOLN: 0.5000 mg | Freq: Once | INTRAMUSCULAR | Status: DC | PRN

## 2023-05-04 NOTE — Transfer of Care (Signed)
 Immediate Anesthesia Transfer of Care Note  Patient: Sarah Tapia  Procedure(s) Performed: ERCP, WITH INTERVENTION IF INDICATED ERCP,REMOVAL OF COMMON BILE DUCT CALCULUS  Patient Location: PACU  Anesthesia Type:General  Level of Consciousness: drowsy and patient cooperative  Airway & Oxygen Therapy: Patient Spontanous Breathing and Patient connected to nasal cannula oxygen  Post-op Assessment: Report given to RN and Post -op Vital signs reviewed and stable  Post vital signs: Reviewed and stable  Last Vitals:  Vitals Value Taken Time  BP 147/98 05/04/23 1115  Temp    Pulse 94 05/04/23 1117  Resp 19 05/04/23 1117  SpO2 100 % 05/04/23 1117  Vitals shown include unfiled device data.  Last Pain:  Vitals:   05/04/23 0902  TempSrc: Temporal  PainSc: 0-No pain         Complications: No notable events documented.

## 2023-05-04 NOTE — Op Note (Signed)
 [facility] Patient Name: Sarah Tapia Procedure Date : 05/04/2023 MRN: [id number] Attending MD: Lynann Bologna , MD, [id number] Date of Birth: March 23, 1955 CSN: [id number] Age: 68 Admit Type: Inpatient Procedure:                ERCP Indications:              Bile duct stone(s) on MRCP with recent biliary                            pancreatitis. Providers:                Lynann Bologna, MD, Lorenza Evangelist, RN, Marja Kays, Technician Referring MD:             Eartha Inch. York Cerise, Dr. Adela Lank Medicines:                General Anesthesia, Rocephin 1 g IV, Indomethacin                            100 mg PR, glucagon 1 mg in divided doses, also                            given dose of Solu-Medrol/Benadryl d/t ? H/O                            contrast allergy. Complications:            No immediate complications. Estimated Blood Loss:     Estimated blood loss: none. Procedure:                Pre-Anesthesia Assessment:                           - Prior to the procedure, a History and Physical                            was performed, and patient medications and                            allergies were reviewed. The patient's tolerance of                            previous anesthesia was also reviewed. The risks                            and benefits of the procedure and the sedation                            options and risks were discussed with the patient.                            All questions were answered, and informed consent  was obtained. Prior Anticoagulants: The patient has                            taken no anticoagulant or antiplatelet agents. ASA                            Grade Assessment: III - A patient with severe                            systemic disease. After reviewing the risks and                            benefits, the patient was deemed in satisfactory                            condition to  undergo the procedure.                           After obtaining informed consent, the scope was                            passed under direct vision. Throughout the                            procedure, the patient's blood pressure, pulse, and                            oxygen saturations were monitored continuously. The                            [id number]-Q190V ([id number] Olympus duodenoscope was                            introduced through the mouth, and used to inject                            contrast into and used to inject contrast into the                            dorsal and ventral pancreatic ducts. The ERCP was                            accomplished without difficulty. The patient                            tolerated the procedure well. Scope In: Scope Out: Findings:      The scout film was normal. The esophagus was successfully intubated       under direct vision. The scope was advanced to major papilla in the       descending duodenum without detailed examination of the pharynx, larynx       and associated structures, and upper GI tract. The upper GI tract was       grossly normal. There was no resistance to passage of scope from  esophagus to the stomach (previous history of Heller's myotomy)      Major papilla was edematous with a small diverticulum. The bile duct was       deeply cannulated with the short-nosed traction sphincterotome using a       wire-guided approach. Contrast was injected. I personally interpreted       the bile duct images. Ductal flow of contrast was adequate. Image       quality was adequate. Contrast extended to the entire biliary tree.      Multiple filling defects consistent with choledocholithiasis was found       in the distal CBD. The biliary tree was mildly dilated. Cystic duct was       patent. Multiple filling defects in the gallbladder consistent with       cholelithiasis. The gallbladder was dilated.      An 8 mm biliary  sphincterotomy was made with a monofilament traction       (standard) sphincterotome using ERBE electrocautery at 12 o'clock       position. There was no post-sphincterotomy bleeding. The biliary tree       was swept with a 9-12 mm balloon starting at the bifurcation several       times. Multiple (6-8) yellowish stones were removed. Post occlusion       cholangiogram did not reveal any residual filling defects.      PD was intentionally not cannulated or injected. Impression:               - Choledocholithiasis was found. Complete removal                            was accomplished by biliary sphincterotomy and                            balloon extraction.                           - Cholelithiasis with distended gallbladder. Patent                            cystic duct. Recommendation:           - Return patient to hospital ward for ongoing care.                           - Watch for pancreatitis, bleeding, perforation,                            and cholangitis.                           - The findings and recommendations were discussed                            with the patient's family.                           - Clear liquid diet.                           - Recommend laparoscopic cholecystectomy  Procedure Code(s):        --- Professional ---                           [phone], Endoscopic retrograde                            cholangiopancreatography (ERCP); with removal of                            calculi/debris from biliary/pancreatic duct(s)                           43262, Endoscopic retrograde                            cholangiopancreatography (ERCP); with                            sphincterotomy/papillotomy                           [phone], Endoscopic catheterization of the biliary                            ductal system, radiological supervision and                            interpretation Diagnosis Code(s):        --- Professional ---                           K80.50,  Calculus of bile duct without cholangitis                            or cholecystitis without obstruction                           K83.8, Other specified diseases of biliary tract CPT copyright 2022 [employer]. All rights reserved. The codes documented in this report are preliminary and upon coder review may  be revised to meet current compliance requirements. Lynann Bologna, MD 05/04/2023 11:12:27 AM This report has been signed electronically. Number of Addenda: 0

## 2023-05-04 NOTE — Anesthesia Preprocedure Evaluation (Addendum)
 Anesthesia Evaluation  Patient identified by MRN, date of birth, ID band Patient awake    Reviewed: Allergy & Precautions, NPO status , Patient's Chart, lab work & pertinent test results  History of Anesthesia Complications Negative for: history of anesthetic complications  Airway Mallampati: II  TM Distance: >3 FB Neck ROM: Full    Dental  (+) Caps, Dental Advisory Given   Pulmonary asthma , COPD,  COPD inhaler   breath sounds clear to auscultation       Cardiovascular hypertension, Pt. on medications (-) angina  Rhythm:Regular Rate:Normal  '24 ECHO: EF 55 to 60%.  1. The LV has normal function, no regional wall motion abnormalities. Grade I diastolic  dysfunction (impaired relaxation).   2. RVF is normal. The right ventricular size is normal.   3. The mitral valve is normal in structure. Mild mitral valve regurgitation. No evidence of mitral stenosis.   4. The aortic valve is tricuspid. Aortic valve regurgitation is not visualized. No aortic stenosis is present.     Neuro/Psych   Anxiety Depression    negative neurological ROS     GI/Hepatic ,GERD  Medicated and Controlled,,Cholelithiasis: elevated LFTs   Endo/Other  Prediabetic: diet controlled  Renal/GU negative Renal ROS     Musculoskeletal   Abdominal   Peds  Hematology negative hematology ROS (+)   Anesthesia Other Findings   Reproductive/Obstetrics                             Anesthesia Physical Anesthesia Plan  ASA: 3  Anesthesia Plan: General   Post-op Pain Management: Minimal or no pain anticipated   Induction: Intravenous  PONV Risk Score and Plan: 3 and Ondansetron, Dexamethasone and Treatment may vary due to age or medical condition  Airway Management Planned: Oral ETT  Additional Equipment: None  Intra-op Plan:   Post-operative Plan: Extubation in OR  Informed Consent: I have reviewed the patients History  and Physical, chart, labs and discussed the procedure including the risks, benefits and alternatives for the proposed anesthesia with the patient or authorized representative who has indicated his/her understanding and acceptance.     Dental advisory given  Plan Discussed with: CRNA and Surgeon  Anesthesia Plan Comments:         Anesthesia Quick Evaluation

## 2023-05-04 NOTE — Anesthesia Postprocedure Evaluation (Signed)
 Anesthesia Post Note  Patient: Sarah Tapia  Procedure(s) Performed: ERCP, WITH INTERVENTION IF INDICATED ERCP,REMOVAL OF COMMON BILE DUCT CALCULUS     Patient location during evaluation: PACU Anesthesia Type: General Level of consciousness: awake and alert, patient cooperative and oriented Pain management: pain level controlled Vital Signs Assessment: post-procedure vital signs reviewed and stable Respiratory status: spontaneous breathing, nonlabored ventilation and respiratory function stable Cardiovascular status: blood pressure returned to baseline and stable Postop Assessment: no apparent nausea or vomiting Anesthetic complications: no   No notable events documented.  Last Vitals:  Vitals:   05/04/23 1130 05/04/23 1145  BP: (!) 148/90 (!) 153/91  Pulse: 78 65  Resp: 20 17  Temp:  (!) 36.2 C  SpO2: 98% 99%    Last Pain:  Vitals:   05/04/23 1145  TempSrc:   PainSc: 0-No pain                 Ciani Rutten,E. Trinnity Breunig

## 2023-05-04 NOTE — Interval H&P Note (Signed)
 History and Physical Interval Note:  05/04/2023 9:34 AM  Sarah Tapia  has presented today for surgery, with the diagnosis of Choledocholithiasis.  The various methods of treatment have been discussed with the patient and family. After consideration of risks, benefits and other options for treatment, the patient has consented to  Procedure(s): ERCP, WITH INTERVENTION IF INDICATED (N/A) as a surgical intervention.  The patient's history has been reviewed, patient examined, no change in status, stable for surgery.  I have reviewed the patient's chart and labs.  Questions were answered to the patient's satisfaction.     Lynann Bologna

## 2023-05-04 NOTE — Anesthesia Procedure Notes (Signed)
 Procedure Name: Intubation Date/Time: 05/04/2023 10:05 AM  Performed by: Dairl Ponder, CRNAPre-anesthesia Checklist: Patient identified, Emergency Drugs available, Suction available and Patient being monitored Patient Re-evaluated:Patient Re-evaluated prior to induction Oxygen Delivery Method: Circle System Utilized Preoxygenation: Pre-oxygenation with 100% oxygen Induction Type: IV induction Ventilation: Mask ventilation without difficulty Laryngoscope Size: Mac and 3 Grade View: Grade I Tube type: Oral Tube size: 7.0 mm Number of attempts: 1 Airway Equipment and Method: Stylet and Oral airway Placement Confirmation: ETT inserted through vocal cords under direct vision, positive ETCO2 and breath sounds checked- equal and bilateral Secured at: 22 cm Tube secured with: Tape Dental Injury: Teeth and Oropharynx as per pre-operative assessment

## 2023-05-04 NOTE — Progress Notes (Signed)
  Progress Note   Patient: Sarah Tapia VQQ:[id number] DOB: Aug 26, 1955 DOA: 05/03/2023     1 DOS: the patient was seen and examined on 05/04/2023   Brief hospital course: 68 y.o. female with medical history significant of anemia, GERD with history of Barrett's esophagitis, dyslipidemia, hypertension, depressive disorder p/w epigastric pain that radiated to the back with nausea and vomiting x 3 days.  Found to have choledocholithiasis necessitating ERCP.  Assessment and Plan:  Acute pancreatitis secondary to acute choledocholithiasis - GI following closely.  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  Status post ERCP 4/6 noting multiple stones and sludge, now resolving.  Continue on IV fluids.  Clear liquid diet.  Likely lap chole to follow.  Possible acute cholecystitis - General Surgery consulted.  Likely lap chole 1-2 days now that ERCP complete.  Hypokalemia - Resuscitation on board.  Hypertension - Hydralazine IV as needed.  GERD - Initiate PPI.  Depressive disorder - Continue Lexapro, Seroquel.      Subjective: Patient just returning from ERCP.  Lethargic and resting.  Family at bedside.  Tolerated procedure well.  Denies any fever, chills, chest pain, shortness of breath.  Physical Exam: Vitals:   05/04/23 0902 05/04/23 1115 05/04/23 1130 05/04/23 1145  BP: (!) 150/83 (!) 147/98 (!) 148/90 (!) 153/91  Pulse: 75 100 78 65  Resp: 18 18 20 17   Temp: 97.6 F (36.4 C) (!) 97.1 F (36.2 C)  (!) 97.1 F (36.2 C)  TempSrc: Temporal     SpO2: 100% 100% 98% 99%  Weight: 64 kg     Height: 5' 2.99" (1.6 m)      GENERAL:  Alert, pleasant, no acute distress  HEENT:  EOMI CARDIOVASCULAR:  RRR, no murmurs appreciated RESPIRATORY:  Clear to auscultation, no wheezing, rales, or rhonchi GASTROINTESTINAL:  Soft, minimally diffusely tender, nondistended EXTREMITIES:  No LE edema bilaterally NEURO:  No new focal deficits appreciated SKIN:  No rashes noted PSYCH:   Appropriate mood and affect   Data Reviewed:  There are no new results to review at this time.  Family Communication: Family at bedside  Disposition: Status is: Inpatient Remains inpatient appropriate because: Pancreatitis secondary to choledocholithiasis  Planned Discharge Destination: Home    Time spent: 34 minutes  Author: Deanna Artis, DO 05/04/2023 12:43 PM  For on call review [email or web address].

## 2023-05-05 ENCOUNTER — Other Ambulatory Visit: Payer: Self-pay

## 2023-05-05 ENCOUNTER — Encounter (HOSPITAL_COMMUNITY): Payer: Self-pay | Admitting: Internal Medicine

## 2023-05-05 ENCOUNTER — Inpatient Hospital Stay (HOSPITAL_COMMUNITY): Admitting: Anesthesiology

## 2023-05-05 ENCOUNTER — Encounter (HOSPITAL_COMMUNITY)
Admission: EM | Disposition: A | Discharge status: Home / Self Care | Payer: Self-pay | Source: Other Acute Inpatient Hospital | Attending: Nurse Practitioner

## 2023-05-05 ENCOUNTER — Inpatient Hospital Stay (HOSPITAL_COMMUNITY)

## 2023-05-05 DIAGNOSIS — K819 Cholecystitis, unspecified: Secondary | ICD-10-CM

## 2023-05-05 DIAGNOSIS — K8050 Calculus of bile duct without cholangitis or cholecystitis without obstruction: Secondary | ICD-10-CM | POA: Diagnosis not present

## 2023-05-05 DIAGNOSIS — K851 Biliary acute pancreatitis without necrosis or infection: Secondary | ICD-10-CM | POA: Diagnosis not present

## 2023-05-05 HISTORY — PX: CHOLECYSTECTOMY: SHX55

## 2023-05-05 LAB — COMPREHENSIVE METABOLIC PANEL WITH GFR
ALT: 344 U/L — ABNORMAL HIGH (ref 0–44)
AST: 139 U/L — ABNORMAL HIGH (ref 15–41)
Albumin: 3.8 g/dL (ref 3.5–5.0)
Alkaline Phosphatase: 105 U/L (ref 38–126)
Anion gap: 14 (ref 5–15)
BUN: 17 mg/dL (ref 8–23)
CO2: 24 mmol/L (ref 22–32)
Calcium: 9.4 mg/dL (ref 8.9–10.3)
Chloride: 102 mmol/L (ref 98–111)
Creatinine, Ser: 1.15 mg/dL — ABNORMAL HIGH (ref 0.44–1.00)
GFR, Estimated: 53 mL/min — ABNORMAL LOW (ref >60)
Glucose, Bld: 102 mg/dL — ABNORMAL HIGH (ref 70–99)
Potassium: 4.7 mmol/L (ref 3.5–5.1)
Sodium: 140 mmol/L (ref 135–145)
Total Bilirubin: 1.2 mg/dL (ref 0.0–1.2)
Total Protein: 7.8 g/dL (ref 6.5–8.1)

## 2023-05-05 LAB — MAGNESIUM: Magnesium: 2 mg/dL (ref 1.7–2.4)

## 2023-05-05 LAB — CBC
HCT: 40.8 % (ref 36.0–46.0)
Hemoglobin: 13.2 g/dL (ref 12.0–15.0)
MCH: 27.6 pg (ref 26.0–34.0)
MCHC: 32.4 g/dL (ref 30.0–36.0)
MCV: 85.2 fL (ref 80.0–100.0)
Platelets: 277 10*3/uL (ref 150–400)
RBC: 4.79 MIL/uL (ref 3.87–5.11)
RDW: 12.9 % (ref 11.5–15.5)
WBC: 15 10*3/uL — ABNORMAL HIGH (ref 4.0–10.5)
nRBC: 0 % (ref 0.0–0.2)

## 2023-05-05 LAB — PROTIME-INR
INR: 1 (ref 0.8–1.2)
Prothrombin Time: 13.6 s (ref 11.4–15.2)

## 2023-05-05 SURGERY — LAPAROSCOPIC CHOLECYSTECTOMY WITH INTRAOPERATIVE CHOLANGIOGRAM
Anesthesia: General

## 2023-05-05 MED ORDER — EPHEDRINE 5 MG/ML INJ
INTRAVENOUS | Status: AC
  Filled 2023-05-05: qty 5

## 2023-05-05 MED ORDER — MIDAZOLAM HCL 2 MG/2ML IJ SOLN
INTRAMUSCULAR | Status: AC
  Filled 2023-05-05: qty 2

## 2023-05-05 MED ORDER — CHLORHEXIDINE GLUCONATE 0.12 % MT SOLN
OROMUCOSAL | Status: AC
  Administered 2023-05-05: 15 mL via OROMUCOSAL
  Filled 2023-05-05: qty 15

## 2023-05-05 MED ORDER — PROPOFOL 10 MG/ML IV BOLUS
INTRAVENOUS | Status: DC | PRN
  Administered 2023-05-05: 130 mg via INTRAVENOUS

## 2023-05-05 MED ORDER — FENTANYL CITRATE (PF) 250 MCG/5ML IJ SOLN
INTRAMUSCULAR | Status: DC | PRN
  Administered 2023-05-05 (×4): 50 ug via INTRAVENOUS

## 2023-05-05 MED ORDER — LIDOCAINE 2% (20 MG/ML) 5 ML SYRINGE
INTRAMUSCULAR | Status: AC
  Filled 2023-05-05: qty 5

## 2023-05-05 MED ORDER — BUPIVACAINE-EPINEPHRINE (PF) 0.25% -1:200000 IJ SOLN
INTRAMUSCULAR | Status: AC
  Filled 2023-05-05: qty 30

## 2023-05-05 MED ORDER — ROCURONIUM BROMIDE 10 MG/ML (PF) SYRINGE
PREFILLED_SYRINGE | INTRAVENOUS | Status: AC
  Filled 2023-05-05: qty 20

## 2023-05-05 MED ORDER — INDOCYANINE GREEN 25 MG IV SOLR
25.0000 mg | Freq: Once | INTRAVENOUS | Status: DC
  Filled 2023-05-05: qty 10

## 2023-05-05 MED ORDER — SCOPOLAMINE 1 MG/3DAYS TD PT72
1.0000 | MEDICATED_PATCH | TRANSDERMAL | Status: DC
  Administered 2023-05-05: 1 via TRANSDERMAL

## 2023-05-05 MED ORDER — ROCURONIUM BROMIDE 10 MG/ML (PF) SYRINGE
PREFILLED_SYRINGE | INTRAVENOUS | Status: DC | PRN
  Administered 2023-05-05: 60 mg via INTRAVENOUS

## 2023-05-05 MED ORDER — FENTANYL CITRATE (PF) 100 MCG/2ML IJ SOLN
25.0000 ug | INTRAMUSCULAR | Status: DC | PRN
  Administered 2023-05-05: 25 ug via INTRAVENOUS

## 2023-05-05 MED ORDER — OXYCODONE HCL 5 MG/5ML PO SOLN: 5.0000 mg | Freq: Once | ORAL | Status: AC | PRN

## 2023-05-05 MED ORDER — ORAL CARE MOUTH RINSE: 15.0000 mL | Freq: Once | OROMUCOSAL | Status: AC

## 2023-05-05 MED ORDER — CHLORHEXIDINE GLUCONATE 0.12 % MT SOLN: 15.0000 mL | Freq: Once | OROMUCOSAL | Status: AC

## 2023-05-05 MED ORDER — ONDANSETRON HCL 4 MG/2ML IJ SOLN
INTRAMUSCULAR | Status: AC
  Filled 2023-05-05: qty 2

## 2023-05-05 MED ORDER — SODIUM CHLORIDE 0.9 % IR SOLN
Status: DC | PRN
  Administered 2023-05-05: 1000 mL via INTRAVESICAL

## 2023-05-05 MED ORDER — MIDAZOLAM HCL 2 MG/2ML IJ SOLN
INTRAMUSCULAR | Status: DC | PRN
  Administered 2023-05-05: 2 mg via INTRAVENOUS

## 2023-05-05 MED ORDER — PHENYLEPHRINE 80 MCG/ML (10ML) SYRINGE FOR IV PUSH (FOR BLOOD PRESSURE SUPPORT)
PREFILLED_SYRINGE | INTRAVENOUS | Status: AC
  Filled 2023-05-05: qty 10

## 2023-05-05 MED ORDER — PROPOFOL 10 MG/ML IV BOLUS
INTRAVENOUS | Status: AC
  Filled 2023-05-05: qty 20

## 2023-05-05 MED ORDER — SUGAMMADEX SODIUM 200 MG/2ML IV SOLN
INTRAVENOUS | Status: DC | PRN
  Administered 2023-05-05: 200 mg via INTRAVENOUS

## 2023-05-05 MED ORDER — ONDANSETRON HCL 4 MG/2ML IJ SOLN
INTRAMUSCULAR | Status: DC | PRN
  Administered 2023-05-05: 4 mg via INTRAVENOUS

## 2023-05-05 MED ORDER — 0.9 % SODIUM CHLORIDE (POUR BTL) OPTIME
TOPICAL | Status: DC | PRN
  Administered 2023-05-05: 1000 mL

## 2023-05-05 MED ORDER — FENTANYL CITRATE (PF) 250 MCG/5ML IJ SOLN
INTRAMUSCULAR | Status: AC
  Filled 2023-05-05: qty 5

## 2023-05-05 MED ORDER — DEXAMETHASONE SODIUM PHOSPHATE 10 MG/ML IJ SOLN
INTRAMUSCULAR | Status: AC
  Filled 2023-05-05: qty 1

## 2023-05-05 MED ORDER — LIDOCAINE 2% (20 MG/ML) 5 ML SYRINGE
INTRAMUSCULAR | Status: DC | PRN
Start: 2023-05-05 — End: 2023-05-05
  Administered 2023-05-05: 60 mg via INTRAVENOUS

## 2023-05-05 MED ORDER — ACETAMINOPHEN 10 MG/ML IV SOLN: 1000.0000 mg | Freq: Once | INTRAVENOUS | Status: DC | PRN

## 2023-05-05 MED ORDER — OXYCODONE HCL 5 MG PO TABS: 5.0000 mg | ORAL_TABLET | Freq: Once | ORAL | Status: AC | PRN

## 2023-05-05 MED ORDER — BUPIVACAINE-EPINEPHRINE 0.25% -1:200000 IJ SOLN
INTRAMUSCULAR | Status: DC | PRN
  Administered 2023-05-05: 15 mL

## 2023-05-05 MED ORDER — PHENYLEPHRINE 80 MCG/ML (10ML) SYRINGE FOR IV PUSH (FOR BLOOD PRESSURE SUPPORT)
PREFILLED_SYRINGE | INTRAVENOUS | Status: DC | PRN
  Administered 2023-05-05: 160 ug via INTRAVENOUS

## 2023-05-05 MED ORDER — ACETAMINOPHEN 500 MG PO TABS
ORAL_TABLET | ORAL | Status: AC
  Filled 2023-05-05: qty 2

## 2023-05-05 MED ORDER — OXYCODONE HCL 5 MG PO TABS
ORAL_TABLET | ORAL | Status: AC
  Administered 2023-05-05: 5 mg via ORAL
  Filled 2023-05-05: qty 1

## 2023-05-05 MED ORDER — DROPERIDOL 2.5 MG/ML IJ SOLN: 0.6250 mg | Freq: Once | INTRAMUSCULAR | Status: DC | PRN

## 2023-05-05 MED ORDER — INDOCYANINE GREEN 25 MG IV SOLR
2.5000 mg | Freq: Once | INTRAVENOUS | Status: AC
  Administered 2023-05-05: 2.5 mg via INTRAVENOUS
  Filled 2023-05-05: qty 10

## 2023-05-05 MED ORDER — ACETAMINOPHEN 500 MG PO TABS: 1000.0000 mg | ORAL_TABLET | Freq: Once | ORAL | Status: DC

## 2023-05-05 MED ORDER — DEXAMETHASONE SODIUM PHOSPHATE 10 MG/ML IJ SOLN
INTRAMUSCULAR | Status: DC | PRN
  Administered 2023-05-05: 10 mg via INTRAVENOUS

## 2023-05-05 MED ORDER — LACTATED RINGERS IV SOLN: INTRAVENOUS | Status: DC

## 2023-05-05 MED ORDER — FENTANYL CITRATE (PF) 100 MCG/2ML IJ SOLN
INTRAMUSCULAR | Status: AC
  Administered 2023-05-05: 25 ug via INTRAVENOUS
  Filled 2023-05-05: qty 2

## 2023-05-05 SURGICAL SUPPLY — 40 items
APPLIER CLIP 5 13 M/L LIGAMAX5 (MISCELLANEOUS) ×1 IMPLANT
BLADE CLIPPER SURG (BLADE) IMPLANT
CANISTER SUCT 3000ML PPV (MISCELLANEOUS) ×1 IMPLANT
CHLORAPREP W/TINT 26 (MISCELLANEOUS) ×1 IMPLANT
CLIP APPLIE 5 13 M/L LIGAMAX5 (MISCELLANEOUS) ×1 IMPLANT
COVER MAYO STAND STRL (DRAPES) ×1 IMPLANT
COVER SURGICAL LIGHT HANDLE (MISCELLANEOUS) ×1 IMPLANT
DERMABOND ADVANCED .7 DNX12 (GAUZE/BANDAGES/DRESSINGS) ×1 IMPLANT
DRAPE C-ARM 42X120 X-RAY (DRAPES) ×1 IMPLANT
ELECT REM PT RETURN 9FT ADLT (ELECTROSURGICAL) ×1 IMPLANT
ELECTRODE REM PT RTRN 9FT ADLT (ELECTROSURGICAL) ×1 IMPLANT
GLOVE BIO SURGEON STRL SZ8 (GLOVE) ×1 IMPLANT
GLOVE BIOGEL PI IND STRL 8 (GLOVE) ×1 IMPLANT
GOWN STRL REUS W/ TWL LRG LVL3 (GOWN DISPOSABLE) ×2 IMPLANT
GOWN STRL REUS W/ TWL XL LVL3 (GOWN DISPOSABLE) ×1 IMPLANT
IRRIG SUCT STRYKERFLOW 2 WTIP (MISCELLANEOUS) ×1 IMPLANT
IRRIGATION SUCT STRKRFLW 2 WTP (MISCELLANEOUS) ×1 IMPLANT
KIT BASIN OR (CUSTOM PROCEDURE TRAY) ×1 IMPLANT
KIT TURNOVER KIT B (KITS) ×1 IMPLANT
L-HOOK LAP DISP 36CM (ELECTROSURGICAL) ×1 IMPLANT
LHOOK LAP DISP 36CM (ELECTROSURGICAL) ×1 IMPLANT
NDL 22X1.5 STRL (OR ONLY) (MISCELLANEOUS) ×1 IMPLANT
NEEDLE 22X1.5 STRL (OR ONLY) (MISCELLANEOUS) ×1 IMPLANT
NS IRRIG 1000ML POUR BTL (IV SOLUTION) ×1 IMPLANT
PAD ARMBOARD POSITIONER FOAM (MISCELLANEOUS) ×1 IMPLANT
PENCIL BUTTON HOLSTER BLD 10FT (ELECTRODE) ×1 IMPLANT
POUCH RETRIEVAL ECOSAC 10 (ENDOMECHANICALS) ×1 IMPLANT
SCISSORS LAP 5X35 DISP (ENDOMECHANICALS) ×1 IMPLANT
SET CHOLANGIOGRAPH 5 50 .035 (SET/KITS/TRAYS/PACK) ×1 IMPLANT
SET TUBE SMOKE EVAC HIGH FLOW (TUBING) ×1 IMPLANT
SLEEVE Z-THREAD 5X100MM (TROCAR) ×2 IMPLANT
SPECIMEN JAR SMALL (MISCELLANEOUS) ×1 IMPLANT
SUT VIC AB 4-0 PS2 27 (SUTURE) ×1 IMPLANT
TOWEL GREEN STERILE (TOWEL DISPOSABLE) ×1 IMPLANT
TOWEL GREEN STERILE FF (TOWEL DISPOSABLE) ×1 IMPLANT
TRAY LAPAROSCOPIC MC (CUSTOM PROCEDURE TRAY) ×1 IMPLANT
TROCAR BALLN 12MMX100 BLUNT (TROCAR) ×1 IMPLANT
TROCAR Z-THREAD OPTICAL 5X100M (TROCAR) ×1 IMPLANT
WARMER LAPAROSCOPE (MISCELLANEOUS) ×1 IMPLANT
WATER STERILE IRR 1000ML POUR (IV SOLUTION) ×1 IMPLANT

## 2023-05-05 NOTE — Progress Notes (Signed)
  Progress Note   Patient: Sarah Tapia [id number] DOB: 24-Jul-1955 DOA: 05/03/2023     2 DOS: the patient was seen and examined on 05/05/2023   Brief hospital course: 68 y.o. female with medical history significant of anemia, GERD with history of Barrett's esophagitis, dyslipidemia, hypertension, depressive disorder p/w epigastric pain that radiated to the back with nausea and vomiting x 3 days.  Found to have choledocholithiasis necessitating ERCP.  Assessment and Plan:  Acute pancreatitis secondary to acute choledocholithiasis - GI following closely.  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  Status post ERCP 4/6 noting multiple stones and sludge, now resolving.  S/P lap chole 4/7.  Continue on IV fluids.  Clear liquid diet.    Possible acute cholecystitis - General Surgery consulted.  POD#0 lap chole .   Hypokalemia - Resuscitation on board.  Hypertension - Hydralazine IV as needed.  GERD - Initiate PPI.  Depressive disorder - Continue Lexapro, Seroquel.      Subjective: Patient just returning from lap cholecystectomy earlier this morning.  Pleasant and resting.  Pain is well controlled.  Feeling a bit nauseous but otherwise ok.  Family at bedside.  Tolerated procedure well.  Denies any fever, chills, chest pain, shortness of breath.  Physical Exam: Vitals:   05/05/23 1130 05/05/23 1145 05/05/23 1200 05/05/23 1315  BP: (!) 131/94 (!) 154/86 (!) 144/81 (!) 146/84  Pulse:  88 72 69  Resp:  19 20 17   Temp: 97.8 F (36.6 C)  (!) 97.5 F (36.4 C) 98.2 F (36.8 C)  TempSrc:    Oral  SpO2: 97% 98% 93% 100%  Weight:      Height:       GENERAL:  Alert, pleasant, no acute distress  HEENT:  EOMI CARDIOVASCULAR:  RRR, no murmurs appreciated RESPIRATORY:  Clear to auscultation, no wheezing, rales, or rhonchi GASTROINTESTINAL:  Soft, minimally diffusely tender, nondistended EXTREMITIES:  No LE edema bilaterally NEURO:  No new focal deficits  appreciated SKIN:  No rashes noted PSYCH:  Appropriate mood and affect   Data Reviewed:  There are no new results to review at this time.  Family Communication: Family at bedside  Disposition: Status is: Inpatient Remains inpatient appropriate because: Pancreatitis secondary to choledocholithiasis  Planned Discharge Destination: Home    Time spent: 33 minutes  Author: Deanna Artis, DO 05/05/2023 1:58 PM  For on call review [email or web address].

## 2023-05-05 NOTE — Transfer of Care (Signed)
 Immediate Anesthesia Transfer of Care Note  Patient: Sarah Tapia  Procedure(s) Performed: LAPAROSCOPIC CHOLECYSTECTOMY WITH INTRAOPERATIVE CHOLANGIOGRAM  Patient Location: PACU  Anesthesia Type:General  Level of Consciousness: drowsy and patient cooperative  Airway & Oxygen Therapy: Patient Spontanous Breathing and Patient connected to nasal cannula oxygen  Post-op Assessment: Report given to RN, Post -op Vital signs reviewed and stable, and Patient moving all extremities X 4  Post vital signs: Reviewed and stable  Last Vitals:  Vitals Value Taken Time  BP 131/94 05/05/23 1131  Temp    Pulse 90 05/05/23 1134  Resp 16 05/05/23 1134  SpO2 98 % 05/05/23 1134  Vitals shown include unfiled device data.  Last Pain:  Vitals:   05/05/23 0832  TempSrc:   PainSc: 0-No pain         Complications: No notable events documented.

## 2023-05-05 NOTE — Anesthesia Preprocedure Evaluation (Addendum)
 Anesthesia Evaluation  Patient identified by MRN, date of birth, ID band Patient awake    Reviewed: Allergy & Precautions, NPO status , Patient's Chart, lab work & pertinent test results  Airway Mallampati: II  TM Distance: >3 FB Neck ROM: Full    Dental  (+) Teeth Intact, Dental Advisory Given   Pulmonary neg pulmonary ROS   breath sounds clear to auscultation       Cardiovascular hypertension, Pt. on medications  Rhythm:Regular Rate:Normal     Neuro/Psych  PSYCHIATRIC DISORDERS Anxiety Depression    negative neurological ROS     GI/Hepatic Neg liver ROS,GERD  Medicated,,  Endo/Other  negative endocrine ROS    Renal/GU negative Renal ROS     Musculoskeletal negative musculoskeletal ROS (+)    Abdominal   Peds  Hematology  (+) Blood dyscrasia, anemia   Anesthesia Other Findings   Reproductive/Obstetrics                             Anesthesia Physical Anesthesia Plan  ASA: 2  Anesthesia Plan: General   Post-op Pain Management: Tylenol PO (pre-op)* and Toradol IV (intra-op)*   Induction: Intravenous  PONV Risk Score and Plan: 4 or greater and Ondansetron, Dexamethasone, Midazolam and Scopolamine patch - Pre-op  Airway Management Planned: Oral ETT  Additional Equipment: None  Intra-op Plan:   Post-operative Plan: Extubation in OR  Informed Consent: I have reviewed the patients History and Physical, chart, labs and discussed the procedure including the risks, benefits and alternatives for the proposed anesthesia with the patient or authorized representative who has indicated his/her understanding and acceptance.     Dental advisory given  Plan Discussed with: CRNA  Anesthesia Plan Comments:        Anesthesia Quick Evaluation

## 2023-05-05 NOTE — Op Note (Signed)
 Laparoscopic Cholecystectomy   Pre-operative Diagnosis: choledocholithiasis, S/P ERCP, biliary pancreatitis  Post-operative Diagnosis: choledocholithiasis, S/P ERCP, biliary pancreatitis  Surgeon: Liz Malady   Assistants: none  Anesthesia: General endotracheal anesthesia  Procedure Details  The patient was seen again in the Holding Room. The risks, benefits, complications, treatment options, and expected outcomes were discussed with the patient. The possibilities of reaction to medication, pulmonary aspiration, perforation of viscus, bleeding, recurrent infection, finding a normal gallbladder, the need for additional procedures, failure to diagnose a condition, the possible need to convert to an open procedure, and creating a complication requiring transfusion or operation were discussed with the patient. The likelihood of improving the patient's symptoms with return to their baseline status is good.  The patient and/or family concurred with the proposed plan, giving informed consent. The site of surgery properly noted. The patient was taken to Operating Room, identified as Sarah Tapia and the procedure verified as Laparoscopic Cholecystectomy with Intraoperative Cholangiogram. A Time Out was held and the above information confirmed.  Prior to the induction of general anesthesia, antibiotic prophylaxis was administered. General endotracheal anesthesia was then administered and tolerated well. After the induction, the abdomen was prepped with Chloraprep and draped in the sterile fashion. The patient was positioned in the supine position.  Local anesthetic agent was injected into the skin near the umbilicus and an incision made. We dissected down to the abdominal fascia with blunt dissection.  The fascia was incised vertically and we entered the peritoneal cavity bluntly.  A pursestring suture of 0-Vicryl was placed around the fascial opening.  The Hasson cannula was inserted and secured with  the stay suture.  Pneumoperitoneum was then created with CO2 and tolerated well without any adverse changes in the patient's vital signs. A 5mm port was placed in the subxiphoid position.  Two 5-mm ports were placed in the right upper quadrant. All skin incisions were infiltrated with a local anesthetic agent before making the incision and placing the trocars.   We positioned the patient in reverse Trendelenburg, tilted slightly to the patient's left.  The gallbladder was identified, the fundus grasped and retracted cephalad. Adhesions were lysed bluntly and with the electrocautery where indicated, taking care not to injure any adjacent organs or viscus. The infundibulum was grasped and retracted laterally, exposing the peritoneum overlying the triangle of Calot. This was then divided and exposed in a blunt fashion. A critical view of the cystic duct and cystic artery was obtained.  The cystic duct was clearly identified and bluntly dissected circumferentially. The cystic duct was then ligated with clips and divided. The cystic artery was identified, dissected free, ligated with clips and divided as well. The gallbladder was dissected from the liver bed in retrograde fashion with the electrocautery. The gallbladder was removed and placed in an Ecosac. The liver bed was irrigated and inspected. Hemostasis was achieved with the electrocautery. Copious irrigation was utilized and was repeatedly aspirated until clear.  The gallbladder and Endocatch sac were then removed through the umbilical port site.  The pursestring suture was used to close the umbilical fascia.    We again inspected the right upper quadrant for hemostasis.  Pneumoperitoneum was released as we removed the trocars.  4-0 Vicryl was used to close the skin.  Dermabond was applied.  The patient was then extubated and brought to the recovery room in stable condition. Instrument, sponge, and needle counts were correct at closure and at the conclusion of  the case.   Findings: Cholecystitis without  Cholelithiasis  Estimated Blood Loss: less than 50 mL         Drains: none         Specimens: Gallbladder           Complications: None; patient tolerated the procedure well.         Disposition: PACU - hemodynamically stable.          [name], MD, MPH, FACS Pager: [phone] Laparoscopic Cholecystectomy with IOC Procedure Note

## 2023-05-05 NOTE — Progress Notes (Signed)
 Patient ID: Sarah Tapia, female   DOB: [birth year], 68 y.o.   MRN: [id number] * Day of Surgery *    Subjective: In preop holding.  Last epigastric pain. ROS negative except as listed above. Objective: Vital signs in last 24 hours: Temp:  [97.1 F (36.2 C)-98.3 F (36.8 C)] 98.3 F (36.8 C) (04/07 0824) Pulse Rate:  [65-100] 84 (04/07 0824) Resp:  [16-20] 18 (04/07 0824) BP: (113-153)/(69-98) 142/69 (04/07 0824) SpO2:  [97 %-100 %] 99 % (04/07 0824) Weight:  [59.4 kg-64 kg] 59.4 kg (04/07 0824) Last BM Date : 05/02/23  Intake/Output from previous day: 04/06 0701 - 04/07 0700 In: 1021.5 [P.O.:360; I.V.:500; IV Piggyback:161.5] Out: -  Intake/Output this shift: No intake/output data recorded.  General appearance: alert and cooperative Resp: clear to auscultation bilaterally GI: soft, mild epig TTP  Lab Results: CBC  Recent Labs    05/04/23 0526 05/05/23 0738  WBC 5.4 15.0*  HGB 12.9 13.2  HCT 40.9 40.8  PLT 234 277   BMET Recent Labs    05/03/23 0541 05/04/23 0526  NA 142 139  K 3.4* 3.6  CL 106 103  CO2 31 26  GLUCOSE 97 125*  BUN 7* <5*  CREATININE 0.78 0.66  CALCIUM 8.9 8.5*   PT/INR Recent Labs    05/03/23 0541  LABPROT 13.8  INR 1.0   ABG No results for input(s): "PHART", "HCO3" in the last 72 hours.  Invalid input(s): "PCO2", "PO2"  Studies/Results: DG ERCP Result Date: 05/04/2023 CLINICAL DATA:  Choledocholithiasis EXAM: ERCP TECHNIQUE: Multiple spot images obtained with the fluoroscopic device and submitted for interpretation post-procedure. COMPARISON:  MRCP 05/02/2023 FINDINGS: A series of fluoroscopic spot images document endoscopic cannulation of the CBD with subsequent contrast opacification. Cystic duct is patent. Multiple nonobstructing stones in the gallbladder lumen. Limited intrahepatic biliary duct opacification and evaluation. IMPRESSION: 1. Cholelithiasis. 2. See procedure report for additional details. These images were submitted for  radiologic interpretation only. Please see the procedural report for the amount of contrast and the fluoroscopy time utilized. Electronically Signed   By: Corlis Leak M.D.   On: 05/04/2023 15:22    Anti-infectives: Anti-infectives (From admission, onward)    Start     Dose/Rate Route Frequency Ordered Stop   05/03/23 0930  [MAR Hold]  metroNIDAZOLE (FLAGYL) IVPB 500 mg        (MAR Hold since Mon 05/05/2023 at 0831.Hold Reason: Transfer to a Procedural area)   500 mg 100 mL/hr over 60 Minutes Intravenous Every 12 hours 05/03/23 0824     05/03/23 0915  [MAR Hold]  cefTRIAXone (ROCEPHIN) 2 g in sodium chloride 0.9 % 100 mL IVPB        (MAR Hold since Mon 05/05/2023 at 0831.Hold Reason: Transfer to a Procedural area)   2 g 200 mL/hr over 30 Minutes Intravenous Every 24 hours 05/03/23 0824     05/03/23 0900  ciprofloxacin (CIPRO) IVPB 400 mg  Status:  Discontinued        400 mg 200 mL/hr over 60 Minutes Intravenous 2 times daily 05/03/23 0806 05/03/23 0807   05/03/23 0900  metroNIDAZOLE (FLAGYL) IVPB 500 mg  Status:  Discontinued        500 mg 100 mL/hr over 60 Minutes Intravenous 2 times daily 05/03/23 0806 05/03/23 4098       Assessment/Plan: Choledocholithiasis and biliary pancreatitis -status post ERCP 05/04/2023 by Dr. Chales Abrahams.  Lipase has normalized.  GI has signed off.  For laparoscopic cholecystectomy and possible cholangiogram today.  I discussed the procedure, risks, and benefits with her and her daughter.  Answered their questions.  They are agreeable.  I also discussed the expected postoperative course.  LOS: 2 days    [name], MD, MPH, FACS Trauma & General Surgery Use [email or web address] to contact on call provider  05/05/2023

## 2023-05-05 NOTE — Progress Notes (Signed)
 [facility] Progress Note  Chief Complaint: Choledocholithiasis and biliary pancreatitis  History:  Signout received from Dr. Adela Lank, extensive chart review performed.  Dr. Urban Gibson ERCP report from yesterday also reviewed.  Laurene's daughter is at the bedside for the entire visit.  Bentlee reports some ongoing colicky right upper quadrant pain radiating around to the back.  Denies nausea or vomiting.  Pain is decreased from what it was at the time of admission.  She did not have any worsening of it after yesterday's procedure and denies black or bloody stool or hematemesis.  ROS: Cardiovascular: No chest pain Respiratory: No dyspnea Urinary: No dysuria  Objective:   Current Facility-Administered Medications:    acetaminophen (TYLENOL) tablet 650 mg, 650 mg, Oral, Q6H PRN **OR** acetaminophen (TYLENOL) suppository 650 mg, 650 mg, Rectal, Q6H PRN, Howerter, Justin B, DO   amLODipine (NORVASC) tablet 5 mg, 5 mg, Oral, Daily, Junious Silk L, NP   cefTRIAXone (ROCEPHIN) 2 g in sodium chloride 0.9 % 100 mL IVPB, 2 g, Intravenous, Q24H, Russella Dar, NP, Stopped at 05/04/23 0926   escitalopram (LEXAPRO) tablet 20 mg, 20 mg, Oral, Daily, Russella Dar, NP   HYDROmorphone (DILAUDID) injection 0.5 mg, 0.5 mg, Intravenous, Q2H PRN, Howerter, Justin B, DO, 0.5 mg at 05/04/23 0227   indomethacin (INDOCIN) 50 MG suppository 100 mg, 100 mg, Rectal, Once, Meredith Pel, NP   melatonin tablet 3 mg, 3 mg, Oral, QHS PRN, Howerter, Justin B, DO   metroNIDAZOLE (FLAGYL) IVPB 500 mg, 500 mg, Intravenous, Q12H, Russella Dar, NP, Last Rate: 100 mL/hr at 05/04/23 2114, 500 mg at 05/04/23 2114   naloxone [facility]) injection 0.4 mg, 0.4 mg, Intravenous, PRN, Howerter, Justin B, DO   ondansetron (ZOFRAN) injection 4 mg, 4 mg, Intravenous, Q6H PRN, Howerter, Justin B, DO, 4 mg at 05/04/23 0227   pantoprazole (PROTONIX) EC tablet 40 mg, 40 mg, Oral, Daily, Russella Dar, NP   QUEtiapine (SEROQUEL)  tablet 50 mg, 50 mg, Oral, QHS, Russella Dar, NP, 50 mg at 05/04/23 2109   sodium chloride flush (NS) 0.9 % injection 3 mL, 3 mL, Intravenous, Q12H, Russella Dar, NP, 3 mL at 05/04/23 2109   cefTRIAXone (ROCEPHIN)  IV Stopped (05/04/23 9147)   metronidazole 500 mg (05/04/23 2114)     Vital signs in last 24 hrs: Vitals:   05/05/23 0353 05/05/23 0752  BP: 127/73 121/75  Pulse: 87 76  Resp:  16  Temp: 97.7 F (36.5 C) 98 F (36.7 C)  SpO2: 97% 100%    Intake/Output Summary (Last 24 hours) at 05/05/2023 8295 Last data filed at 05/05/2023 0400 Gross per 24 hour  Intake 1021.46 ml  Output --  Net 1021.46 ml     Physical Exam Sitting up comfortably in bed watching television, good spirits HEENT: sclera anicteric, oral mucosa without lesions Neck: supple, no thyromegaly, JVD or lymphadenopathy Cardiac: RRR without murmurs, S1S2 heard, no peripheral edema Pulm: clear to auscultation bilaterally, normal RR and effort noted Abdomen: soft, RUQ tenderness, with active bowel sounds. No guarding or palpable hepatosplenomegaly Skin; warm and dry, no jaundice  Recent Labs:     Latest Ref Rng & Units 05/04/2023    5:26 AM 05/03/2023    5:41 AM 05/02/2023    4:58 PM  CBC  WBC 4.0 - 10.5 K/uL 5.4  5.9  12.0   Hemoglobin 12.0 - 15.0 g/dL 62.1  30.8  65.7   Hematocrit 36.0 - 46.0 % 40.9  41.0  40.8   Platelets 150 - 400 K/uL 234  241  281     Recent Labs  Lab 05/03/23 0541  INR 1.0      Latest Ref Rng & Units 05/04/2023    5:26 AM 05/03/2023    5:41 AM 05/02/2023    4:58 PM  CMP  Glucose 70 - 99 mg/dL 454  97  92   BUN 8 - 23 mg/dL 5  7  17    Creatinine 0.44 - 1.00 mg/dL 0.98  1.19  1.47   Sodium 135 - 145 mmol/L 139  142  138   Potassium 3.5 - 5.1 mmol/L 3.6  3.4  4.7   Chloride 98 - 111 mmol/L 103  106  101   CO2 22 - 32 mmol/L 26  31  29    Calcium 8.9 - 10.3 mg/dL 8.5  8.9  9.3   Total Protein 6.5 - 8.1 g/dL 7.0  7.1  7.4   Total Bilirubin 0.0 - 1.2 mg/dL 1.3  2.4  2.2    Alkaline Phos 38 - 126 U/L 107  112  71   AST 15 - 41 U/L 342  1,085  136   ALT 0 - 44 U/L 485  736  83     This morning CMP is still pending  Radiologic studies:   Assessment & Plan  Assessment: Choledocholithiasis, relieved after ERCP with sphincterotomy and balloon stone extraction by Dr. Chales Abrahams yesterday  Acute biliary pancreatitis.  Lipase had normalized by the time of yesterday's ERCP.   Plan: Laparoscopic cholecystectomy per surgical service/OR availability.  Inpatient GI service signing off-call as needed.  No specific GI clinic follow-up needed.  PPI can be discontinued at discharge.   Charlie Pitter III Office: [phone]

## 2023-05-05 NOTE — Plan of Care (Signed)

## 2023-05-05 NOTE — Anesthesia Procedure Notes (Signed)
 Procedure Name: Intubation Date/Time: 05/05/2023 10:48 AM  Performed by: Alease Medina, CRNAPre-anesthesia Checklist: Patient identified, Emergency Drugs available, Suction available and Patient being monitored Patient Re-evaluated:Patient Re-evaluated prior to induction Oxygen Delivery Method: Circle system utilized Preoxygenation: Pre-oxygenation with 100% oxygen Induction Type: IV induction Ventilation: Mask ventilation without difficulty Laryngoscope Size: Mac and 3 Grade View: Grade I Tube type: Oral Tube size: 7.0 mm Number of attempts: 1 Airway Equipment and Method: Stylet and Oral airway Placement Confirmation: ETT inserted through vocal cords under direct vision, positive ETCO2 and breath sounds checked- equal and bilateral Secured at: 20 cm Tube secured with: Tape Dental Injury: Teeth and Oropharynx as per pre-operative assessment

## 2023-05-05 NOTE — Progress Notes (Signed)
   05/05/23 1420  TOC Brief Assessment  Insurance and Status Reviewed  Patient has primary care physician Yes  Home environment has been reviewed son  Prior level of function: independent walker, has done OP PT at [facility]  Prior/Current Home Services No current home services  Social Drivers of Health Review SDOH reviewed no interventions necessary  Transition of care needs no transition of care needs at this time      Transition of Care Department [facility]) has reviewed patient and no TOC needs have been identified at this time. We will continue to monitor patient advancement through interdisciplinary progression rounds. If new patient transition needs arise, please place a TOC consult.

## 2023-05-06 ENCOUNTER — Other Ambulatory Visit (HOSPITAL_COMMUNITY): Payer: Self-pay

## 2023-05-06 ENCOUNTER — Encounter (HOSPITAL_COMMUNITY): Payer: Self-pay | Admitting: General Surgery

## 2023-05-06 DIAGNOSIS — K8050 Calculus of bile duct without cholangitis or cholecystitis without obstruction: Secondary | ICD-10-CM | POA: Diagnosis not present

## 2023-05-06 LAB — COMPREHENSIVE METABOLIC PANEL WITH GFR
ALT: 227 U/L — ABNORMAL HIGH (ref 0–44)
AST: 80 U/L — ABNORMAL HIGH (ref 15–41)
Albumin: 3.2 g/dL — ABNORMAL LOW (ref 3.5–5.0)
Alkaline Phosphatase: 78 U/L (ref 38–126)
Anion gap: 11 (ref 5–15)
BUN: 13 mg/dL (ref 8–23)
CO2: 29 mmol/L (ref 22–32)
Calcium: 8.9 mg/dL (ref 8.9–10.3)
Chloride: 101 mmol/L (ref 98–111)
Creatinine, Ser: 0.99 mg/dL (ref 0.44–1.00)
GFR, Estimated: >60 mL/min (ref >60)
Glucose, Bld: 108 mg/dL — ABNORMAL HIGH (ref 70–99)
Potassium: 4 mmol/L (ref 3.5–5.1)
Sodium: 141 mmol/L (ref 135–145)
Total Bilirubin: 1 mg/dL (ref 0.0–1.2)
Total Protein: 6.2 g/dL — ABNORMAL LOW (ref 6.5–8.1)

## 2023-05-06 LAB — CBC
HCT: 37.1 % (ref 36.0–46.0)
Hemoglobin: 12 g/dL (ref 12.0–15.0)
MCH: 27.6 pg (ref 26.0–34.0)
MCHC: 32.3 g/dL (ref 30.0–36.0)
MCV: 85.5 fL (ref 80.0–100.0)
Platelets: 234 10*3/uL (ref 150–400)
RBC: 4.34 MIL/uL (ref 3.87–5.11)
RDW: 13.4 % (ref 11.5–15.5)
WBC: 15.4 10*3/uL — ABNORMAL HIGH (ref 4.0–10.5)
nRBC: 0 % (ref 0.0–0.2)

## 2023-05-06 LAB — LIPASE, BLOOD: Lipase: 37 U/L (ref 11–51)

## 2023-05-06 LAB — SURGICAL PATHOLOGY

## 2023-05-06 MED ORDER — OXYCODONE HCL 5 MG PO TABS: 5.0000 mg | ORAL_TABLET | ORAL | Status: DC | PRN

## 2023-05-06 MED ORDER — OXYCODONE HCL 5 MG PO TABS
5.0000 mg | ORAL_TABLET | Freq: Four times a day (QID) | ORAL | 0 refills | Status: DC | PRN
  Filled 2023-05-06: qty 15, 4d supply, fill #0

## 2023-05-06 NOTE — Discharge Instructions (Signed)

## 2023-05-06 NOTE — Progress Notes (Signed)
 1 Day Post-Op  Subjective: CC: Sore around her incisions. Pain well controlled. Tolerating cld without n/v or worsening pain. Voiding. Passing flatus. No BM. Mobilizing. Family at bedside.   Afebrile. No tachycardia or hypotension. LFT's downtrending w/ normal Alk phos and T. Bili. Lipase wnl.   Objective: Vital signs in last 24 hours: Temp:  [97.5 F (36.4 C)-98.2 F (36.8 C)] 98.1 F (36.7 C) (04/08 0737) Pulse Rate:  [59-88] 66 (04/08 0737) Resp:  [16-20] 18 (04/08 0737) BP: (114-154)/(65-94) 130/71 (04/08 0737) SpO2:  [93 %-100 %] 100 % (04/08 0737) Weight:  [63.4 kg] 63.4 kg (04/08 0431) Last BM Date : 05/02/23  Intake/Output from previous day: 04/07 0701 - 04/08 0700 In: 1040 [P.O.:240; I.V.:800] Out: 10 [Blood:10] Intake/Output this shift: No intake/output data recorded.  PE: Gen:  Alert, NAD, pleasant Abd: Soft, no distension, appropriately tender around laparoscopic incisions, no rigidity or guarding and otherwise NT, +BS. Incisions with glue intact appears well and are without drainage, bleeding, or signs of infection.   Lab Results:  Recent Labs    05/05/23 0738 05/06/23 0653  WBC 15.0* 15.4*  HGB 13.2 12.0  HCT 40.8 37.1  PLT 277 234   BMET Recent Labs    05/05/23 0738 05/06/23 0653  NA 140 141  K 4.7 4.0  CL 102 101  CO2 24 29  GLUCOSE 102* 108*  BUN 17 13  CREATININE 1.15* 0.99  CALCIUM 9.4 8.9   PT/INR Recent Labs    05/05/23 0738  LABPROT 13.6  INR 1.0   CMP     Component Value Date/Time   NA 141 05/06/2023 0653   NA 136 01/23/2013 0754   K 4.0 05/06/2023 0653   K 3.6 01/23/2013 0754   CL 101 05/06/2023 0653   CL 103 01/23/2013 0754   CO2 29 05/06/2023 0653   CO2 26 01/23/2013 0754   GLUCOSE 108 (H) 05/06/2023 0653   GLUCOSE 115 (H) 01/23/2013 0754   BUN 13 05/06/2023 0653   BUN 4 (L) 01/23/2013 0754   CREATININE 0.99 05/06/2023 0653   CREATININE 0.94 01/23/2013 0754   CALCIUM 8.9 05/06/2023 0653   CALCIUM 8.2 (L)  01/23/2013 0754   PROT 6.2 (L) 05/06/2023 0653   PROT 8.1 05/05/2012 1242   ALBUMIN 3.2 (L) 05/06/2023 0653   ALBUMIN 4.1 05/05/2012 1242   AST 80 (H) 05/06/2023 0653   AST 28 05/05/2012 1242   ALT 227 (H) 05/06/2023 0653   ALT 23 05/05/2012 1242   ALKPHOS 78 05/06/2023 0653   ALKPHOS 89 05/05/2012 1242   BILITOT 1.0 05/06/2023 0653   BILITOT 0.9 05/05/2012 1242   GFRNONAA >60 05/06/2023 0653   GFRNONAA >60 01/23/2013 0754   GFRAA >60 04/11/2017 1013   GFRAA >60 01/23/2013 0754   Lipase     Component Value Date/Time   LIPASE 37 05/06/2023 0653    Studies/Results: No results found.  Anti-infectives: Anti-infectives (From admission, onward)    Start     Dose/Rate Route Frequency Ordered Stop   05/03/23 0930  metroNIDAZOLE (FLAGYL) IVPB 500 mg        500 mg 100 mL/hr over 60 Minutes Intravenous Every 12 hours 05/03/23 0824     05/03/23 0915  cefTRIAXone (ROCEPHIN) 2 g in sodium chloride 0.9 % 100 mL IVPB        2 g 200 mL/hr over 30 Minutes Intravenous Every 24 hours 05/03/23 0824     05/03/23 0900  ciprofloxacin (CIPRO) IVPB 400 mg  Status:  Discontinued        400 mg 200 mL/hr over 60 Minutes Intravenous 2 times daily 05/03/23 0806 05/03/23 0807   05/03/23 0900  metroNIDAZOLE (FLAGYL) IVPB 500 mg  Status:  Discontinued        500 mg 100 mL/hr over 60 Minutes Intravenous 2 times daily 05/03/23 0806 05/03/23 4098        Assessment/Plan POD 1 s/p laparoscopic cholecystectomy by Dr. Janee Morn on 05/05/23 for biliary pancreatitis choledocholithiasis s/p ERCP (4/6) - Adv diet - Mobilize, pulm toilet - If patient tolerates diet advancement, okay for d/c from our standpoint. Will arrange f/u and send pain meds to the pharmacy. No additional abx needed from a gen surgery standpoint.   FEN - Adv to reg diet.  VTE - SCDs, okay for chem ppx from a general surgery standpoint ID - Rocephin/Flagyl    LOS: 3 days    Jacinto Halim, [facility] 05/06/2023,  11:29 AM Please see Amion for pager number during day hours 7:00am-4:30pm

## 2023-05-06 NOTE — Progress Notes (Signed)
 Patient tolerated regular diet without complaints. Notified provider.

## 2023-05-06 NOTE — Discharge Summary (Signed)
 Physician Discharge Summary   Patient: Sarah Tapia MRN: [id number] DOB: [birth year]  Admit date:     05/03/2023  Discharge date: 05/06/23  Discharge Physician: Deanna Artis   PCP: Dorothey Baseman, MD   Recommendations at discharge:   At this time patient will be discharged home.  If you experience any symptoms such as fever, vomiting, shortness of breath, chest pain, abdominal pain, or other concerning symptoms, please call your primary care provider or go to the emergency department immediately.  Discharge Diagnoses: Principal Problem:   Choledocholithiasis Active Problems:   Gallstone pancreatitis  Resolved Problems:   * No resolved hospital problems. *  Hospital Course: 68 y.o. female with medical history significant of anemia, GERD with history of Barrett's esophagitis, dyslipidemia, hypertension, depressive disorder p/w epigastric pain that radiated to the back with nausea and vomiting x 3 days.  Found to have choledocholithiasis necessitating ERCP.   Assessment and Plan:  Acute pancreatitis secondary to acute choledocholithiasis - Initial imaging suggesting multiple sclerosis large with LFTs.  GI and general surgery following closely.  Status post ERCP 4/6 noting multiple stones and sludge, now resolving.  S/P lap chole 4/7.  Tolerated advancement in diet.  No need for ongoing antibiotics.  Patient to follow-up with general surgery in the outpatient setting.   Possible acute cholecystitis - See above.   Hypokalemia - Resolved after resuscitation   Hypertension - Resume home medication regimen   GERD - Resume home medication regiment   Depressive disorder - Resume home medication regimen         Pain control -  Controlled Substance Reporting System database was reviewed. and patient was instructed, not to drive, operate heavy machinery, perform activities at heights, swimming or participation in water activities or provide baby-sitting services  while on Pain, Sleep and Anxiety Medications; until their outpatient Physician has advised to do so again. Also recommended to not to take more than prescribed Pain, Sleep and Anxiety Medications.  Consultants: GI, general surgery Procedures performed: ERCP, laparoscopic cholecystectomy Disposition: Home Diet recommendation:  Discharge Diet Orders (From admission, onward)     Start     Ordered   05/06/23 0000  Diet - low sodium heart healthy        05/06/23 1334           Regular diet DISCHARGE MEDICATION: Allergies as of 05/06/2023       Reactions   Latex Hives   Metoprolol Hives   Penicillins Hives, Itching   Ivp Dye [iodinated Contrast Media] Hives, Itching        Medication List     TAKE these medications    albuterol 108 (90 Base) MCG/ACT inhaler Commonly known as: VENTOLIN HFA Inhale 1-2 puffs into the lungs every 6 (six) hours as needed for wheezing or shortness of breath.   ALPRAZolam 0.5 MG tablet Commonly known as: XANAX Take 0.5 mg by mouth daily as needed for anxiety.   amLODipine 5 MG tablet Commonly known as: NORVASC Take 5 mg by mouth daily.   citalopram 20 MG tablet Commonly known as: CELEXA Take 20 mg by mouth daily.   escitalopram 10 MG tablet Commonly known as: LEXAPRO Take 10 mg by mouth daily.   multivitamin with minerals Tabs tablet Take 1 tablet by mouth daily.   ondansetron 4 MG disintegrating tablet Commonly known as: ZOFRAN-ODT Take 1 tablet (4 mg total) by mouth every 8 (eight) hours as needed for nausea or vomiting.   oxyCODONE 5 MG immediate release  tablet Commonly known as: Oxy IR/ROXICODONE Take 1 tablet (5 mg total) by mouth every 6 (six) hours as needed for breakthrough pain.   pantoprazole 40 MG tablet Commonly known as: PROTONIX Take 40 mg by mouth daily.   Vitamin D-3 25 MCG (1000 UT) Caps Take 1,000 Units by mouth daily.   zolpidem 5 MG tablet Commonly known as: AMBIEN Take 5 mg by mouth at bedtime as needed  for sleep.        Follow-up Information     Maczis, Hedda Slade, PA-C Follow up.   Specialty: General Surgery Why: Call to confirm your appointment date and time, bring a copy of your photo ID and insurance card, arrive 30 minutes prior to your appointment Contact information: [address] [place] [address] [place] [address] [phone]                Discharge Exam: Ceasar Mons Weights   05/05/23 0500 05/05/23 0824 05/06/23 0431  Weight: 63.1 kg 59.4 kg 63.4 kg   GENERAL:  Alert, pleasant, no acute distress  HEENT:  EOMI CARDIOVASCULAR:  RRR, no murmurs appreciated RESPIRATORY:  Clear to auscultation, no wheezing, rales, or rhonchi GASTROINTESTINAL:  Soft, minimally diffusely tender, bandages over surgical incisions EXTREMITIES:  No LE edema bilaterally NEURO:  No new focal deficits appreciated SKIN:  No rashes noted PSYCH:  Appropriate mood and affect   Condition at discharge: improving  The results of significant diagnostics from this hospitalization (including imaging, microbiology, ancillary and laboratory) are listed below for reference.   Imaging Studies: DG ERCP Result Date: 05/04/2023 CLINICAL DATA:  Choledocholithiasis EXAM: ERCP TECHNIQUE: Multiple spot images obtained with the fluoroscopic device and submitted for interpretation post-procedure. COMPARISON:  MRCP 05/02/2023 FINDINGS: A series of fluoroscopic spot images document endoscopic cannulation of the CBD with subsequent contrast opacification. Cystic duct is patent. Multiple nonobstructing stones in the gallbladder lumen. Limited intrahepatic biliary duct opacification and evaluation. IMPRESSION: 1. Cholelithiasis. 2. See procedure report for additional details. These images were submitted for radiologic interpretation only. Please see the procedural report for the amount of contrast and the fluoroscopy time utilized. Electronically Signed   By: Corlis Leak M.D.   On: 05/04/2023 15:22   MR ABDOMEN MRCP W WO  CONTAST Result Date: 05/02/2023 CLINICAL DATA:  Right upper quadrant and epigastric pain, nausea and vomiting, cholelithiasis and gallbladder wall thickening on earlier imaging EXAM: MRI ABDOMEN WITHOUT AND WITH CONTRAST (INCLUDING MRCP) TECHNIQUE: Multiplanar multisequence MR imaging of the abdomen was performed both before and after the administration of intravenous contrast. Heavily T2-weighted images of the biliary and pancreatic ducts were obtained, and three-dimensional MRCP images were rendered by post processing. CONTRAST:  6mL GADAVIST GADOBUTROL 1 MMOL/ML IV SOLN COMPARISON:  Ultrasound and CT 05/02/2023 FINDINGS: Lower chest: No acute pleural or parenchymal lung disease. Hepatobiliary: Multiple large gallstones are identified within the gallbladder lumen. Mild gallbladder wall thickening measuring 4 mm. No pericholecystic fluid. 0.8 cm cyst within the inferior aspect right lobe liver. Otherwise liver parenchyma is unremarkable. There is no intrahepatic biliary duct dilation. Cystic duct appears patent. The common bile duct measures up to 5 mm in diameter, with filling defects in the downstream common bile duct measuring up to 4 mm in diameter and extending approximately 2.3 cm in length, consistent with multiple choledocholithiasis. Pancreas: No mass, inflammatory changes, or other parenchymal abnormality identified. Spleen:  Within normal limits in size and appearance. Adrenals/Urinary Tract: No masses identified. No evidence of hydronephrosis. Stomach/Bowel: Visualized portions within the abdomen  are unremarkable. Vascular/Lymphatic: No pathologically enlarged lymph nodes identified. No abdominal aortic aneurysm demonstrated. Other:  No free fluid.  No abdominal wall hernia. Musculoskeletal: No suspicious bone lesions identified. IMPRESSION: 1. Cholelithiasis, with borderline gallbladder wall thickening measuring 4 mm. There is no pericholecystic fluid or other inflammatory change. Findings are  equivocal for acute cholecystitis. 2. Multiple filling defects within the downstream common bile duct measuring up to 4 mm in diameter, compatible with choledocholithiasis. 3. No other acute findings. Electronically Signed   By: Sharlet Salina M.D.   On: 05/02/2023 23:52   MR 3D Recon At Scanner Result Date: 05/02/2023 CLINICAL DATA:  Right upper quadrant and epigastric pain, nausea and vomiting, cholelithiasis and gallbladder wall thickening on earlier imaging EXAM: MRI ABDOMEN WITHOUT AND WITH CONTRAST (INCLUDING MRCP) TECHNIQUE: Multiplanar multisequence MR imaging of the abdomen was performed both before and after the administration of intravenous contrast. Heavily T2-weighted images of the biliary and pancreatic ducts were obtained, and three-dimensional MRCP images were rendered by post processing. CONTRAST:  6mL GADAVIST GADOBUTROL 1 MMOL/ML IV SOLN COMPARISON:  Ultrasound and CT 05/02/2023 FINDINGS: Lower chest: No acute pleural or parenchymal lung disease. Hepatobiliary: Multiple large gallstones are identified within the gallbladder lumen. Mild gallbladder wall thickening measuring 4 mm. No pericholecystic fluid. 0.8 cm cyst within the inferior aspect right lobe liver. Otherwise liver parenchyma is unremarkable. There is no intrahepatic biliary duct dilation. Cystic duct appears patent. The common bile duct measures up to 5 mm in diameter, with filling defects in the downstream common bile duct measuring up to 4 mm in diameter and extending approximately 2.3 cm in length, consistent with multiple choledocholithiasis. Pancreas: No mass, inflammatory changes, or other parenchymal abnormality identified. Spleen:  Within normal limits in size and appearance. Adrenals/Urinary Tract: No masses identified. No evidence of hydronephrosis. Stomach/Bowel: Visualized portions within the abdomen are unremarkable. Vascular/Lymphatic: No pathologically enlarged lymph nodes identified. No abdominal aortic aneurysm  demonstrated. Other:  No free fluid.  No abdominal wall hernia. Musculoskeletal: No suspicious bone lesions identified. IMPRESSION: 1. Cholelithiasis, with borderline gallbladder wall thickening measuring 4 mm. There is no pericholecystic fluid or other inflammatory change. Findings are equivocal for acute cholecystitis. 2. Multiple filling defects within the downstream common bile duct measuring up to 4 mm in diameter, compatible with choledocholithiasis. 3. No other acute findings. Electronically Signed   By: Sharlet Salina M.D.   On: 05/02/2023 23:52   CT ABDOMEN PELVIS WO CONTRAST Result Date: 05/02/2023 CLINICAL DATA:  Epigastric pain radiating to back, nausea and vomiting for 3 days EXAM: CT ABDOMEN AND PELVIS WITHOUT CONTRAST TECHNIQUE: Multidetector CT imaging of the abdomen and pelvis was performed following the standard protocol without IV contrast. Recommend follow up pre and post contrast MRI/MRCP or pancreatic protocol CT in 1 year. This recommendation follows ACR consensus guidelines: Management of Incidental Pancreatic Cysts: A White Paper of the ACR Incidental Findings Committee. J Am Coll Radiol 2017;14:911-923. RADIATION DOSE REDUCTION: This exam was performed according to the departmental dose-optimization program which includes automated exposure control, adjustment of the mA and/or kV according to patient size and/or use of iterative reconstruction technique. COMPARISON:  05/02/2023 ultrasound FINDINGS: Lower chest: No acute pleural or parenchymal lung disease. Hepatobiliary: Gallbladder is moderately distended, with no evidence of calcified gallstones. Gallbladder wall is thickened measuring up to 5 mm. Please see preceding right upper quadrant ultrasound report describing shadowing gallstones. Unremarkable unenhanced appearance of the liver. No biliary duct dilation. Pancreas: Unremarkable unenhanced appearance. Spleen: Unremarkable unenhanced appearance. Adrenals/Urinary  Tract: No urinary  tract calculi or obstructive uropathy within either kidney. The adrenals and bladder are unremarkable. Stomach/Bowel: No bowel obstruction or ileus. Normal appendix right lower quadrant. No bowel wall thickening or inflammatory change. Diverticulum off the gastric fundus. Vascular/Lymphatic: No significant vascular findings are present. No enlarged abdominal or pelvic lymph nodes. Reproductive: Status post hysterectomy. No adnexal masses. Other: No free fluid or free intraperitoneal gas. No abdominal wall hernia. Musculoskeletal: No acute or destructive bony abnormalities. Reconstructed images demonstrate no additional findings. IMPRESSION: 1. Gallbladder wall thickening. The shadowing gallstones seen by ultrasound are not visualized by CT. The appearance of the gallbladder could suggest acute cholecystitis, and correlation with clinical presentation and laboratory evaluation is recommended. 2. No urinary tract calculi or obstructive uropathy. 3. Normal appendix. Electronically Signed   By: Sharlet Salina M.D.   On: 05/02/2023 20:22   US Abdomen Limited RUQ (LIVER/GB) Result Date: 05/02/2023 CLINICAL DATA:  Right upper quadrant pain for 1 day EXAM: ULTRASOUND ABDOMEN LIMITED RIGHT UPPER QUADRANT COMPARISON:  None Available. FINDINGS: Gallbladder: Multiple shadowing gallstones are identified, largest measuring 1.7 cm in size. Gallbladder wall is thickened measuring up to 6 mm. Negative sonographic Murphy sign. No pericholecystic fluid. Common bile duct: Diameter: 5 mm Liver: No focal lesion identified. Within normal limits in parenchymal echogenicity. Portal vein is patent on color Doppler imaging with normal direction of blood flow towards the liver. Other: None. IMPRESSION: 1. Cholelithiasis, with gallbladder wall thickening measuring up to 6 mm. This is consistent with acute cholecystitis. If further imaging is desired, nuclear medicine hepatobiliary scan could be considered. Electronically Signed   By: Sharlet Salina M.D.   On: 05/02/2023 20:15    Microbiology: Results for orders placed or performed during the hospital encounter of 05/03/23  Surgical PCR screen     Status: None   Collection Time: 05/03/23  5:22 AM   Specimen: Nasal Mucosa; Nasal Swab  Result Value Ref Range Status   MRSA, PCR NEGATIVE NEGATIVE Final   Staphylococcus aureus NEGATIVE NEGATIVE Final    Comment: (NOTE) The Xpert SA Assay (FDA approved for NASAL specimens in patients 66 years of age and older), is one component of a comprehensive surveillance program. It is not intended to diagnose infection nor to guide or monitor treatment. Performed at [facility], [address] [place], [place] [address]   Urine Culture (for pregnant, neutropenic or urologic patients or patients with an indwelling urinary catheter)     Status: Abnormal   Collection Time: 05/03/23  7:54 AM   Specimen: Urine, Clean Catch  Result Value Ref Range Status   Specimen Description URINE, CLEAN CATCH  Final   Special Requests NONE  Final   Culture (A)  Final    <10,000 COLONIES/mL INSIGNIFICANT GROWTH Performed at [facility], [address] [place], [place] [address]    Report Status 05/04/2023 FINAL  Final    Labs: CBC: Recent Labs  Lab 05/02/23 1658 05/03/23 0541 05/04/23 0526 05/05/23 0738 05/06/23 0653  WBC 12.0* 5.9 5.4 15.0* 15.4*  NEUTROABS  --  3.6  --   --   --   HGB 13.3 13.4 12.9 13.2 12.0  HCT 40.8 41.0 40.9 40.8 37.1  MCV 85.9 84.4 86.3 85.2 85.5  PLT 281 241 234 277 234   Basic Metabolic Panel: Recent Labs  Lab 05/02/23 1658 05/03/23 0541 05/04/23 0526 05/05/23 0738 05/06/23 0653  NA 138 142 139 140 141  K 4.7 3.4* 3.6 4.7  4.0  CL 101 106 103 102 101  CO2 29 31 26 24 29   GLUCOSE 92 97 125* 102* 108*  BUN 17 7* <5* 17 13  CREATININE 0.82 0.78 0.66 1.15* 0.99  CALCIUM 9.3 8.9 8.5* 9.4 8.9  MG  --  2.1  --  2.0  --    Liver Function Tests: Recent Labs  Lab 05/02/23 1658 05/03/23 0541  05/04/23 0526 05/05/23 0738 05/06/23 0653  AST 136* 1,085* 342* 139* 80*  ALT 83* 736* 485* 344* 227*  ALKPHOS 71 112 107 105 78  BILITOT 2.2* 2.4* 1.3* 1.2 1.0  PROT 7.4 7.1 7.0 7.8 6.2*  ALBUMIN 3.9 3.5 3.5 3.8 3.2*   CBG: No results for input(s): "GLUCAP" in the last 168 hours.  Discharge time spent: greater than 30 minutes.  Signed: Deanna Artis, DO [facility] 05/06/2023

## 2023-05-07 NOTE — Anesthesia Postprocedure Evaluation (Signed)
 Anesthesia Post Note  Patient: Sarah Tapia  Procedure(s) Performed: LAPAROSCOPIC CHOLECYSTECTOMY WITH INTRAOPERATIVE CHOLANGIOGRAM     Patient location during evaluation: PACU Anesthesia Type: General Level of consciousness: awake and alert Pain management: pain level controlled Vital Signs Assessment: post-procedure vital signs reviewed and stable Respiratory status: spontaneous breathing, nonlabored ventilation and respiratory function stable Cardiovascular status: blood pressure returned to baseline and stable Postop Assessment: no apparent nausea or vomiting Anesthetic complications: no   No notable events documented.               Brasen Bundren

## 2023-05-26 ENCOUNTER — Observation Stay
Admission: EM | Admit: 2023-05-26 | Discharge: 2023-05-27 | Disposition: A | Discharge status: Home / Self Care | Attending: Emergency Medicine | Admitting: Emergency Medicine

## 2023-05-26 ENCOUNTER — Emergency Department

## 2023-05-26 DIAGNOSIS — R109 Unspecified abdominal pain: Secondary | ICD-10-CM | POA: Diagnosis not present

## 2023-05-26 DIAGNOSIS — R197 Diarrhea, unspecified: Secondary | ICD-10-CM | POA: Insufficient documentation

## 2023-05-26 DIAGNOSIS — Z9049 Acquired absence of other specified parts of digestive tract: Secondary | ICD-10-CM | POA: Diagnosis not present

## 2023-05-26 DIAGNOSIS — Z9889 Other specified postprocedural states: Secondary | ICD-10-CM | POA: Insufficient documentation

## 2023-05-26 DIAGNOSIS — F411 Generalized anxiety disorder: Secondary | ICD-10-CM | POA: Diagnosis not present

## 2023-05-26 DIAGNOSIS — R259 Unspecified abnormal involuntary movements: Secondary | ICD-10-CM | POA: Insufficient documentation

## 2023-05-26 DIAGNOSIS — R10816 Epigastric abdominal tenderness: Secondary | ICD-10-CM | POA: Diagnosis not present

## 2023-05-26 DIAGNOSIS — K449 Diaphragmatic hernia without obstruction or gangrene: Secondary | ICD-10-CM | POA: Diagnosis not present

## 2023-05-26 DIAGNOSIS — Z91040 Latex allergy status: Secondary | ICD-10-CM | POA: Diagnosis not present

## 2023-05-26 DIAGNOSIS — R112 Nausea with vomiting, unspecified: Secondary | ICD-10-CM | POA: Insufficient documentation

## 2023-05-26 DIAGNOSIS — I10 Essential (primary) hypertension: Secondary | ICD-10-CM | POA: Insufficient documentation

## 2023-05-26 DIAGNOSIS — E860 Dehydration: Secondary | ICD-10-CM | POA: Diagnosis not present

## 2023-05-26 DIAGNOSIS — R531 Weakness: Secondary | ICD-10-CM | POA: Diagnosis not present

## 2023-05-26 DIAGNOSIS — R55 Syncope and collapse: Secondary | ICD-10-CM | POA: Diagnosis not present

## 2023-05-26 DIAGNOSIS — Z79899 Other long term (current) drug therapy: Secondary | ICD-10-CM | POA: Diagnosis not present

## 2023-05-26 LAB — CBC WITH DIFFERENTIAL/PLATELET
Abs Immature Granulocytes: 0.02 10*3/uL (ref 0.00–0.07)
Basophils Absolute: 0 10*3/uL (ref 0.0–0.1)
Basophils Relative: 1 %
Eosinophils Absolute: 0.1 10*3/uL (ref 0.0–0.5)
Eosinophils Relative: 2 %
HCT: 37.9 % (ref 36.0–46.0)
Hemoglobin: 12 g/dL (ref 12.0–15.0)
Immature Granulocytes: 0 %
Lymphocytes Relative: 25 %
Lymphs Abs: 1.6 10*3/uL (ref 0.7–4.0)
MCH: 27.1 pg (ref 26.0–34.0)
MCHC: 31.7 g/dL (ref 30.0–36.0)
MCV: 85.6 fL (ref 80.0–100.0)
Monocytes Absolute: 0.4 10*3/uL (ref 0.1–1.0)
Monocytes Relative: 6 %
Neutro Abs: 4.2 10*3/uL (ref 1.7–7.7)
Neutrophils Relative %: 66 %
Platelets: 289 10*3/uL (ref 150–400)
RBC: 4.43 MIL/uL (ref 3.87–5.11)
RDW: 12.9 % (ref 11.5–15.5)
WBC: 6.3 10*3/uL (ref 4.0–10.5)
nRBC: 0 % (ref 0.0–0.2)

## 2023-05-26 LAB — URINALYSIS, W/ REFLEX TO CULTURE (INFECTION SUSPECTED)
Bacteria, UA: NONE SEEN
Bilirubin Urine: NEGATIVE
Glucose, UA: NEGATIVE mg/dL
Hgb urine dipstick: NEGATIVE
Ketones, ur: NEGATIVE mg/dL
Leukocytes,Ua: NEGATIVE
Nitrite: NEGATIVE
Protein, ur: NEGATIVE mg/dL
RBC / HPF: 0 RBC/hpf (ref 0–5)
Specific Gravity, Urine: 1.003 — ABNORMAL LOW (ref 1.005–1.030)
pH: 8 (ref 5.0–8.0)

## 2023-05-26 LAB — COMPREHENSIVE METABOLIC PANEL WITH GFR
ALT: 22 U/L (ref 0–44)
AST: 20 U/L (ref 15–41)
Albumin: 3.5 g/dL (ref 3.5–5.0)
Alkaline Phosphatase: 67 U/L (ref 38–126)
Anion gap: 10 (ref 5–15)
BUN: 7 mg/dL — ABNORMAL LOW (ref 8–23)
CO2: 27 mmol/L (ref 22–32)
Calcium: 8.3 mg/dL — ABNORMAL LOW (ref 8.9–10.3)
Chloride: 106 mmol/L (ref 98–111)
Creatinine, Ser: 0.74 mg/dL (ref 0.44–1.00)
GFR, Estimated: >60 mL/min (ref >60)
Glucose, Bld: 121 mg/dL — ABNORMAL HIGH (ref 70–99)
Potassium: 3.8 mmol/L (ref 3.5–5.1)
Sodium: 143 mmol/L (ref 135–145)
Total Bilirubin: 1 mg/dL (ref 0.0–1.2)
Total Protein: 6.9 g/dL (ref 6.5–8.1)

## 2023-05-26 LAB — LACTIC ACID, PLASMA
Lactic Acid, Venous: 1.3 mmol/L (ref 0.5–1.9)
Lactic Acid, Venous: 1.4 mmol/L (ref 0.5–1.9)

## 2023-05-26 LAB — RESP PANEL BY RT-PCR (RSV, FLU A&B, COVID)  RVPGX2
Influenza A by PCR: NEGATIVE
Influenza B by PCR: NEGATIVE
Resp Syncytial Virus by PCR: NEGATIVE
SARS Coronavirus 2 by RT PCR: NEGATIVE

## 2023-05-26 LAB — TROPONIN I (HIGH SENSITIVITY)
Troponin I (High Sensitivity): 3 ng/L (ref <18)
Troponin I (High Sensitivity): 4 ng/L (ref <18)

## 2023-05-26 LAB — LIPASE, BLOOD: Lipase: 35 U/L (ref 11–51)

## 2023-05-26 MED ORDER — LACTATED RINGERS IV BOLUS
1000.0000 mL | Freq: Once | INTRAVENOUS | Status: AC
  Administered 2023-05-26: 1000 mL via INTRAVENOUS

## 2023-05-26 MED ORDER — IOHEXOL 300 MG/ML  SOLN
100.0000 mL | Freq: Once | INTRAMUSCULAR | Status: AC | PRN
  Administered 2023-05-26: 100 mL via INTRAVENOUS

## 2023-05-26 MED ORDER — DIPHENHYDRAMINE HCL 25 MG PO CAPS: 50.0000 mg | ORAL_CAPSULE | Freq: Once | ORAL | Status: AC

## 2023-05-26 MED ORDER — DIPHENHYDRAMINE HCL 50 MG/ML IJ SOLN
50.0000 mg | Freq: Once | INTRAMUSCULAR | Status: AC
  Administered 2023-05-26: 50 mg via INTRAVENOUS
  Filled 2023-05-26: qty 1

## 2023-05-26 MED ORDER — ONDANSETRON HCL 4 MG/2ML IJ SOLN
4.0000 mg | Freq: Once | INTRAMUSCULAR | Status: AC
  Administered 2023-05-26: 4 mg via INTRAVENOUS
  Filled 2023-05-26: qty 2

## 2023-05-26 MED ORDER — CITALOPRAM HYDROBROMIDE 20 MG PO TABS
20.0000 mg | ORAL_TABLET | Freq: Every day | ORAL | Status: DC
  Administered 2023-05-27: 20 mg via ORAL
  Filled 2023-05-26: qty 1

## 2023-05-26 MED ORDER — ALPRAZOLAM 0.5 MG PO TABS
0.5000 mg | ORAL_TABLET | Freq: Every evening | ORAL | Status: DC | PRN
  Administered 2023-05-26: 0.5 mg via ORAL
  Filled 2023-05-26: qty 1

## 2023-05-26 MED ORDER — ONDANSETRON 4 MG PO TBDP: 4.0000 mg | ORAL_TABLET | Freq: Four times a day (QID) | ORAL | Status: DC | PRN

## 2023-05-26 MED ORDER — METHYLPREDNISOLONE SODIUM SUCC 40 MG IJ SOLR
40.0000 mg | Freq: Once | INTRAMUSCULAR | Status: AC
  Administered 2023-05-26: 40 mg via INTRAVENOUS
  Filled 2023-05-26: qty 1

## 2023-05-26 MED ORDER — ENOXAPARIN SODIUM 40 MG/0.4ML IJ SOSY
40.0000 mg | PREFILLED_SYRINGE | INTRAMUSCULAR | Status: DC
  Administered 2023-05-26: 40 mg via SUBCUTANEOUS
  Filled 2023-05-26: qty 0.4

## 2023-05-26 MED ORDER — MORPHINE SULFATE (PF) 4 MG/ML IV SOLN
4.0000 mg | Freq: Once | INTRAVENOUS | Status: AC
  Administered 2023-05-26: 4 mg via INTRAVENOUS
  Filled 2023-05-26: qty 1

## 2023-05-26 NOTE — ED Triage Notes (Signed)
 Pt presents to the ED via [facility] from home. Pt had her gallbladder removed on 4/7. Pt has had diarrhea for the past 3 days and when she wipes there is blood present.   BP: 150/74 HR: 99 99% on RA

## 2023-05-26 NOTE — H&P (Signed)
 History and Physical    Sarah Tapia ZOX:[id number] DOB: 25-Dec-1955 DOA: 05/26/2023  PCP: Rory Collard, MD  Patient coming from: home   Chief Complaint: abd pain, diarrhea  HPI: Sarah Tapia is a 68 y.o. female with medical history significant for htn, recent gallstone pancreatitis s/p ercp and cholecystectomy, presenting with the above.  Yesterday developed epigastric abd pain as well as non-bloody diarrhea, also nauseaus but no vomiting. Po has been poor. No fever, no chest pain, no cough, no dysuria. Felt unwell, daughter alerted ems. While they were on their way syncopized and shook after that, daughter witnessed this, no trauma. Since then has had twitching around her mouth. Syncopized again when EMS arrived. No history of any of this   Review of Systems: As per HPI otherwise 10 point review of systems negative.    Past Medical History:  Diagnosis Date   Anxiety    Gastritis    GERD (gastroesophageal reflux disease)    Hypertension    Pre-diabetes     Past Surgical History:  Procedure Laterality Date   ABDOMINAL HYSTERECTOMY     CHOLECYSTECTOMY N/A 05/05/2023   Procedure: LAPAROSCOPIC CHOLECYSTECTOMY WITH INTRAOPERATIVE CHOLANGIOGRAM;  Surgeon: Dorena Gander, MD;  Location: [facility] OR;  Service: General;  Laterality: N/A;  POSSIBLE IOC   ERCP N/A 05/04/2023   Procedure: ERCP, WITH INTERVENTION IF INDICATED;  Surgeon: Lajuan Pila, MD;  Location: [facility] ENDOSCOPY;  Service: Gastroenterology;  Laterality: N/A;   STONE EXTRACTION WITH BASKET  05/04/2023   Procedure: ERCP,REMOVAL OF COMMON BILE DUCT CALCULUS;  Surgeon: Lajuan Pila, MD;  Location: [facility] ENDOSCOPY;  Service: Gastroenterology;;     reports that she has never smoked. She has never used smokeless tobacco. She reports that she does not drink alcohol and does not use drugs.  Allergies  Allergen Reactions   Latex Hives   Metoprolol  Hives   Penicillins Hives and Itching   Ivp Dye [Iodinated Contrast Media] Hives and Itching     Family History  Problem Relation Age of Onset   Breast cancer Sister 79    Prior to Admission medications   Medication Sig Start Date End Date Taking? Authorizing Provider  albuterol  (PROVENTIL  HFA;VENTOLIN  HFA) 108 (90 Base) MCG/ACT inhaler Inhale 1-2 puffs into the lungs every 6 (six) hours as needed for wheezing or shortness of breath.    [provider]  ALPRAZolam  (XANAX ) 0.5 MG tablet Take 0.5 mg by mouth daily as needed for anxiety. 03/25/17   [provider]  amLODipine  (NORVASC ) 5 MG tablet Take 5 mg by mouth daily.    [provider]  Cholecalciferol (VITAMIN D -3) 1000 units CAPS Take 1,000 Units by mouth daily.    [provider]  citalopram (CELEXA) 20 MG tablet Take 20 mg by mouth daily.    [provider]  escitalopram  (LEXAPRO ) 10 MG tablet Take 10 mg by mouth daily. Patient not taking: Reported on 05/03/2023 02/25/17   [provider]  Multiple Vitamin (MULTIVITAMIN WITH MINERALS) TABS tablet Take 1 tablet by mouth daily.    [provider]  ondansetron  (ZOFRAN -ODT) 4 MG disintegrating tablet Take 1 tablet (4 mg total) by mouth every 8 (eight) hours as needed for nausea or vomiting. 05/17/22   Twilla Galea, MD  oxyCODONE  (OXY IR/ROXICODONE ) 5 MG immediate release tablet Take 1 tablet (5 mg total) by mouth every 6 (six) hours as needed for breakthrough pain. 05/06/23   Maczis, Michael M, PA-C  pantoprazole  (PROTONIX ) 40 MG tablet Take 40 mg by mouth  daily.    [provider]  zolpidem (AMBIEN) 5 MG tablet Take 5 mg by mouth at bedtime as needed for sleep. 01/30/17   [provider]    Physical Exam: Vitals:   05/26/23 0919 05/26/23 0930 05/26/23 1230 05/26/23 1700  BP: (!) 153/83 (!) 161/85 125/78 (!) 142/80  Pulse: 73 70 (!) 58 67  Resp: 18 19 13 20   Temp: 98.4 F (36.9 C)     TempSrc: Oral     SpO2:  100% 100% 100%  Weight:      Height:        Appears somewhat ill Atraumatic  head Involuntary twitching of muscles around mouth Lungs clear Heart rrr Abdomen is soft and non-tender Extremities are warm, no edema   Labs on Admission: I have personally reviewed following labs and imaging studies  CBC: Recent Labs  Lab 05/26/23 0930  WBC 6.3  NEUTROABS 4.2  HGB 12.0  HCT 37.9  MCV 85.6  PLT 289   Basic Metabolic Panel: Recent Labs  Lab 05/26/23 0930  NA 143  K 3.8  CL 106  CO2 27  GLUCOSE 121*  BUN 7*  CREATININE 0.74  CALCIUM  8.3*   GFR: Estimated Creatinine Clearance: 58 mL/min (by C-G formula based on SCr of 0.74 mg/dL). Liver Function Tests: Recent Labs  Lab 05/26/23 0930  AST 20  ALT 22  ALKPHOS 67  BILITOT 1.0  PROT 6.9  ALBUMIN 3.5   Recent Labs  Lab 05/26/23 0930  LIPASE 35   No results for input(s): "AMMONIA" in the last 168 hours. Coagulation Profile: No results for input(s): "INR", "PROTIME" in the last 168 hours. Cardiac Enzymes: No results for input(s): "CKTOTAL", "CKMB", "CKMBINDEX", "TROPONINI" in the last 168 hours. BNP (last 3 results) No results for input(s): "PROBNP" in the last 8760 hours. HbA1C: No results for input(s): "HGBA1C" in the last 72 hours. CBG: No results for input(s): "GLUCAP" in the last 168 hours. Lipid Profile: No results for input(s): "CHOL", "HDL", "LDLCALC", "TRIG", "CHOLHDL", "LDLDIRECT" in the last 72 hours. Thyroid  Function Tests: No results for input(s): "TSH", "T4TOTAL", "FREET4", "T3FREE", "THYROIDAB" in the last 72 hours. Anemia Panel: No results for input(s): "VITAMINB12", "FOLATE", "FERRITIN", "TIBC", "IRON", "RETICCTPCT" in the last 72 hours. Urine analysis:    Component Value Date/Time   COLORURINE COLORLESS (A) 05/26/2023 1008   APPEARANCEUR CLEAR (A) 05/26/2023 1008   APPEARANCEUR Clear 09/25/2011 1630   LABSPEC 1.003 (L) 05/26/2023 1008   LABSPEC 1.003 09/25/2011 1630   PHURINE 8.0 05/26/2023 1008   GLUCOSEU NEGATIVE 05/26/2023 1008   GLUCOSEU Negative 09/25/2011  1630   HGBUR NEGATIVE 05/26/2023 1008   BILIRUBINUR NEGATIVE 05/26/2023 1008   BILIRUBINUR Negative 09/25/2011 1630   KETONESUR NEGATIVE 05/26/2023 1008   PROTEINUR NEGATIVE 05/26/2023 1008   NITRITE NEGATIVE 05/26/2023 1008   LEUKOCYTESUR NEGATIVE 05/26/2023 1008   LEUKOCYTESUR Trace 09/25/2011 1630    Radiological Exams on Admission: CT ABDOMEN PELVIS W CONTRAST Result Date: 05/26/2023 CLINICAL DATA:  Postoperative abdominal pain. Recent cholecystectomy. EXAM: CT ABDOMEN AND PELVIS WITH CONTRAST TECHNIQUE: Multidetector CT imaging of the abdomen and pelvis was performed using the standard protocol following bolus administration of intravenous contrast. RADIATION DOSE REDUCTION: This exam was performed according to the departmental dose-optimization program which includes automated exposure control, adjustment of the mA and/or kV according to patient size and/or use of iterative reconstruction technique. CONTRAST:  100mL OMNIPAQUE IOHEXOL 300 MG/ML  SOLN COMPARISON:  CT abdomen pelvis dated 05/02/2023. FINDINGS: Lower chest: Minimal  bibasilar linear atelectasis/scarring. No intra-abdominal free air or free fluid. Hepatobiliary: Subcentimeter hypodense lesion in the inferior right lobe of the liver is too small to characterize, possibly a cyst or hemangioma. The liver is otherwise unremarkable. There is biliary ductal dilatation, post cholecystectomy. No retained calcified stone noted in the central CBD. Trace subhepatic fluid along the cholecystectomy bed, likely postoperative seroma. No fluid collection or abscess. Pancreas: Unremarkable. No pancreatic ductal dilatation or surrounding inflammatory changes. Spleen: Normal in size without focal abnormality. Adrenals/Urinary Tract: The adrenal glands are unremarkable. There is no hydronephrosis on either side. There is symmetric enhancement and excretion of contrast by both kidneys. The visualized ureters and urinary bladder appear unremarkable.  Stomach/Bowel: There is a small hiatal hernia. There is no bowel obstruction or active inflammation. The appendix is normal. Vascular/Lymphatic: The abdominal aorta and IVC are unremarkable. No portal gas. There is no adenopathy. Reproductive: Hysterectomy.  No suspicious adnexal masses. Other: None Musculoskeletal: No acute or significant osseous findings. IMPRESSION: 1. No acute intra-abdominal or pelvic pathology. 2. Biliary ductal dilatation, post cholecystectomy. No retained calcified stone noted in the central CBD. 3. No bowel obstruction. Normal appendix. Electronically Signed   By: Angus Bark M.D.   On: 05/26/2023 14:37    EKG: Independently reviewed. nsr  Assessment/Plan Principal Problem:   Syncope Active Problems:   Hypertension   Diarrhea   # Syncope Likely 2/2 hypovolemia from diarrhea. Home amlodipine  may have contributed. EKG nothing acute - continue fluids - check orthostats  # Diarrhea No vomiting. No stool since arrival - gi pathogen panel and c diff if able to produce  # Epigastric abdominal pain With recent gallstone pancreatitis s/p ercp and cholecystectomy. LFTs and lipase are wnl, cbc normal, CT of abdomen shows dilated bile duct but no stone. Pain has improved - continue fluids - diarrhea w/u as above - would discuss with GI if continues to have pain tomorrow  # involuntary muscle movement Twitching of muscles around mouth. Calcium  is mildly low, potassium wnl - will observe response to fluids, would discuss w/ neurology tomorrow if persists  # GAD - cont home xanax , celexa  # HTN Bp appropriate - hold home amlodipine  as above  DVT prophylaxis: lovenox  Code Status: full  Family Communication: daughter updated @ bedside  Consults called: none   Level of care: Telemetry Medical Status is: Observation The patient remains OBS appropriate and will d/c before 2 midnights.    Raymonde Calico MD [facility] Pager [phone]  If 7PM-7AM,  please contact night-coverage [email or web address] Password [facility]  05/26/2023, 5:44 PM

## 2023-05-26 NOTE — ED Provider Notes (Signed)
 Sarah Tapia Provider Note    Event Date/Time   First MD Initiated Contact with Patient 05/26/23 [phone]     (approximate)   History   Emesis   HPI  Kazumi Mcclammy is a 68 y.o. female with history of GERD, hypertension, gastritis, anxiety, presenting with nausea vomiting, abdominal pain and syncopal episodes.  Patient states that yesterday she was having upper abdominal pain as well as nausea vomiting, also had some diarrhea.  States that she wiped and noticed some blood on the tissue paper but not in the toilet.  States that she has had episodes of diarrhea this morning without any noted blood on the tissue or in the toilet.  States that she has not been able to tolerate p.o. since yesterday.  No chest pain or difficulty breathing.  Per EMS, she had a syncopal episode today, as they are moving her, she had 2 more syncopal episodes with them.  Patient notes lightheadedness prior to passing out.  She denies any fevers, urinary symptoms.  Independent history obtained from EMS as above.  On independent review, she was discharged in early April after she was found to have choledocholithiasis necessitating ERCP as well as cholecystectomy.  The cholecystectomy was done on 7 April, patient states that she is supposed to follow-up with the surgeon in 2 weeks.  Denies any redness to the surgical site, no purulent drainage.     Physical Exam   Triage Vital Signs: ED Triage Vitals  Encounter Vitals Group     BP      Systolic BP Percentile      Diastolic BP Percentile      Pulse      Resp      Temp      Temp src      SpO2      Weight      Height      Head Circumference      Peak Flow      Pain Score      Pain Loc      Pain Education      Exclude from Growth Chart     Most recent vital signs: Vitals:   05/26/23 0930 05/26/23 1230  BP: (!) 161/85 125/78  Pulse: 70 (!) 58  Resp: 19 13  Temp:    SpO2: 100% 100%     General: Awake, no distress.  CV:  Good  peripheral perfusion.  Resp:  Normal effort.  Clear Abd:  No distention.  Soft, tender in the epigastric, right upper quadrant as well as periumbilical region, surgical sites are intact, no overlying erythema, induration, fluctuance, drainage. Other:  Moving all 4 extremities, no unilateral calf swelling or tenderness   ED Results / Procedures / Treatments   Labs (all labs ordered are listed, but only abnormal results are displayed) Labs Reviewed  COMPREHENSIVE METABOLIC PANEL WITH GFR - Abnormal; Notable for the following components:      Result Value   Glucose, Bld 121 (*)    BUN 7 (*)    Calcium  8.3 (*)    All other components within normal limits  URINALYSIS, W/ REFLEX TO CULTURE (INFECTION SUSPECTED) - Abnormal; Notable for the following components:   Color, Urine COLORLESS (*)    APPearance CLEAR (*)    Specific Gravity, Urine 1.003 (*)    All other components within normal limits  RESP PANEL BY RT-PCR (RSV, FLU A&B, COVID)  RVPGX2  C DIFFICILE QUICK SCREEN W PCR  REFLEX    GASTROINTESTINAL PANEL BY PCR, STOOL (REPLACES STOOL CULTURE)  LACTIC ACID, PLASMA  LACTIC ACID, PLASMA  LIPASE, BLOOD  CBC WITH DIFFERENTIAL/PLATELET  TROPONIN I (HIGH SENSITIVITY)  TROPONIN I (HIGH SENSITIVITY)     EKG  Sinus rhythm, rate 69, normal QRS, normal QTc, T wave flattening in aVL, no ischemic ST elevation, T wave flattening is new compared to prior   RADIOLOGY CT on my independent interpretation shows enlarged bladder   PROCEDURES:  Critical Care performed: No  Procedures   MEDICATIONS ORDERED IN ED: Medications  lactated ringers  bolus 1,000 mL (has no administration in time range)  methylPREDNISolone  sodium succinate (SOLU-MEDROL ) 40 mg/mL injection 40 mg (40 mg Intravenous Given 05/26/23 0938)  diphenhydrAMINE  (BENADRYL ) capsule 50 mg ( Oral See Alternative 05/26/23 1235)    Or  diphenhydrAMINE  (BENADRYL ) injection 50 mg (50 mg Intravenous Given 05/26/23 1235)  lactated  ringers  bolus 1,000 mL (1,000 mLs Intravenous New Bag/Given 05/26/23 0938)  morphine  (PF) 4 MG/ML injection 4 mg (4 mg Intravenous Given 05/26/23 0938)  ondansetron  (ZOFRAN ) injection 4 mg (4 mg Intravenous Given 05/26/23 0938)  iohexol (OMNIPAQUE) 300 MG/ML solution 100 mL (100 mLs Intravenous Contrast Given 05/26/23 1314)     IMPRESSION / MDM / ASSESSMENT AND PLAN / ED COURSE  I reviewed the triage vital signs and the nursing notes.                              Differential diagnosis includes, but is not limited to, surgical complication, retained stone, abscess, colitis, diverticulitis, viral illness, dehydration, electrolyte derangements.  Considered PE given the syncopal episode and postsurgery but patient has no shortness of breath or chest pain, no unilateral calf swelling or tenderness, no hypoxia or tachycardia.  Will get labs, EKG, troponin, CT abdomen pelvis.  Will give her IV fluids, IV pain meds and IV Zofran .  Patient gets itching and hives with IV contrast, will pretreat before CT scan.  Patient's presentation is most consistent with acute presentation with potential threat to life or bodily function.  Independent review of labs and imaging below.  CT abdomen pelvis showed enlarged bladder, postvoid was 12 cc.  On reassessment patient is still lightheaded, when she got up to walk, felt very weak.  Given her persistent symptoms, suspect that she will need to be admitted for continued IV rehydration.  Suspect her symptoms are due to her nausea vomiting and diarrhea that has been occurring since yesterday.  Consulted hospitalist who is agreeable to plan for admission will evaluate the patient.  She is admitted.  Clinical Course as of 05/26/23 1552  Mon May 26, 2023  1031 Independent review of labs, UA is not consistent with UTI, no leukocytosis, lipase is normal, lactate not elevated, troponin is not elevated, electrolytes not severely deranged, LFTs are normal. [TT]  1441 CT ABDOMEN  PELVIS W CONTRAST IMPRESSION: 1. No acute intra-abdominal or pelvic pathology. 2. Biliary ductal dilatation, post cholecystectomy. No retained calcified stone noted in the central CBD. 3. No bowel obstruction. Normal appendix.   [TT]  1545 This recent choledocholithiasis status post ERCP with cholecystectomy on April 9.  Recently nausea vomiting and diarrhea.  CT scan without acute findings. Lightheaded and multiple witnessed syncope - abd pain. Recurrently orthostatic - plan to admit for obs. EKG reassuring. Trop neg. Diarrhea today [SM]    Clinical Course User Index [SM] Viviano Ground, MD [TT] Drenda Gentle Richard Champion, MD  FINAL CLINICAL IMPRESSION(S) / ED DIAGNOSES   Final diagnoses:  Nausea vomiting and diarrhea  Dehydration  Syncope, unspecified syncope type     Rx / DC Orders   ED Discharge Orders     None        Note:  This document was prepared using Dragon voice recognition software and may include unintentional dictation errors.    Shane Darling, MD 05/26/23 [phone]

## 2023-05-27 ENCOUNTER — Other Ambulatory Visit: Payer: Self-pay

## 2023-05-27 DIAGNOSIS — R55 Syncope and collapse: Secondary | ICD-10-CM | POA: Diagnosis not present

## 2023-05-27 LAB — CBC
HCT: 38.2 % (ref 36.0–46.0)
Hemoglobin: 12.1 g/dL (ref 12.0–15.0)
MCH: 27.3 pg (ref 26.0–34.0)
MCHC: 31.7 g/dL (ref 30.0–36.0)
MCV: 86.2 fL (ref 80.0–100.0)
Platelets: 295 10*3/uL (ref 150–400)
RBC: 4.43 MIL/uL (ref 3.87–5.11)
RDW: 13 % (ref 11.5–15.5)
WBC: 12.6 10*3/uL — ABNORMAL HIGH (ref 4.0–10.5)
nRBC: 0 % (ref 0.0–0.2)

## 2023-05-27 LAB — BASIC METABOLIC PANEL WITH GFR
Anion gap: 8 (ref 5–15)
BUN: 12 mg/dL (ref 8–23)
CO2: 27 mmol/L (ref 22–32)
Calcium: 8.8 mg/dL — ABNORMAL LOW (ref 8.9–10.3)
Chloride: 105 mmol/L (ref 98–111)
Creatinine, Ser: 0.82 mg/dL (ref 0.44–1.00)
GFR, Estimated: >60 mL/min (ref >60)
Glucose, Bld: 96 mg/dL (ref 70–99)
Potassium: 3.9 mmol/L (ref 3.5–5.1)
Sodium: 140 mmol/L (ref 135–145)

## 2023-05-27 LAB — HIV ANTIBODY (ROUTINE TESTING W REFLEX): HIV Screen 4th Generation wRfx: NONREACTIVE

## 2023-05-27 MED ORDER — ALPRAZOLAM 0.5 MG PO TABS
0.5000 mg | ORAL_TABLET | Freq: Once | ORAL | Status: AC
  Administered 2023-05-27: 0.5 mg via ORAL
  Filled 2023-05-27: qty 1

## 2023-05-27 NOTE — Evaluation (Signed)
 Physical Therapy Evaluation Patient Details Name: Sarah Tapia MRN: [id number] DOB: Nov 21, 1955 Today's Date: 05/27/2023  History of Present Illness  Pt admitted for syncope and complaints of abdominal pain and diarrhea. Recent history includes cholecystectomy earlier this month. Other PMH includes HTN and pancreatitis.  Clinical Impression  Pt is a pleasant 68 year old female who was admitted for syncope. Pt demonstrates all bed mobility/transfers/ambulation at baseline level. No complaints of dizziness. Pt does not require any further PT needs at this time. Pt will be dc in house and does not require follow up. RN aware. Will dc current orders. Orthostatics obtained.   Supine: 126/74; HR 62 Sit:132/93; HR 64 Stand: 135/93; HR 68       If plan is discharge home, recommend the following:     Can travel by private vehicle        Equipment Recommendations None recommended by PT  Recommendations for Other Services       Functional Status Assessment Patient has not had a recent decline in their functional status     Precautions / Restrictions Precautions Precautions: Fall Recall of Precautions/Restrictions: Intact Restrictions Weight Bearing Restrictions Per Provider Order: No      Mobility  Bed Mobility Overal bed mobility: Independent             General bed mobility comments: safe technique    Transfers Overall transfer level: Independent Equipment used: None               General transfer comment: safe technique    Ambulation/Gait Ambulation/Gait assistance: Independent Gait Distance (Feet): 200 Feet Assistive device: None Gait Pattern/deviations: WFL(Within Functional Limits)       General Gait Details: safe technique with slightly slower speed but no balance issues without AD  Stairs            Wheelchair Mobility     Tilt Bed    Modified Rankin (Stroke Patients Only)       Balance Overall balance assessment: Independent                                            Pertinent Vitals/Pain Pain Assessment Pain Assessment: No/denies pain    Home Living Family/patient expects to be discharged to:: Private residence Living Arrangements: Children Available Help at Discharge: Family Type of Home: House         Home Layout: One level Home Equipment: None      Prior Function Prior Level of Function : Independent/Modified Independent;Driving             Mobility Comments: indep, no falls ADLs Comments: indep     Extremity/Trunk Assessment   Upper Extremity Assessment Upper Extremity Assessment: Overall WFL for tasks assessed    Lower Extremity Assessment Lower Extremity Assessment: Overall WFL for tasks assessed       Communication   Communication Communication: No apparent difficulties    Cognition Arousal: Alert Behavior During Therapy: WFL for tasks assessed/performed   PT - Cognitive impairments: No apparent impairments                       PT - Cognition Comments: pleasant and agreeable to session Following commands: Intact       Cueing Cueing Techniques: Verbal cues     General Comments      Exercises     Assessment/Plan  PT Assessment Patient does not need any further PT services  PT Problem List         PT Treatment Interventions      PT Goals (Current goals can be found in the Care Plan section)  Acute Rehab PT Goals Patient Stated Goal: to go home PT Goal Formulation: All assessment and education complete, DC therapy Time For Goal Achievement: 05/27/23 Potential to Achieve Goals: Good    Frequency       Co-evaluation               AM-PAC PT "6 Clicks" Mobility  Outcome Measure Help needed turning from your back to your side while in a flat bed without using bedrails?: None Help needed moving from lying on your back to sitting on the side of a flat bed without using bedrails?: None Help needed moving to and from a bed  to a chair (including a wheelchair)?: None Help needed standing up from a chair using your arms (e.g., wheelchair or bedside chair)?: None Help needed to walk in hospital room?: None Help needed climbing 3-5 steps with a railing? : None 6 Click Score: 24    End of Session   Activity Tolerance: Patient tolerated treatment well Patient left: in bed Nurse Communication: Mobility status PT Visit Diagnosis: Muscle weakness (generalized) (M62.81)    Time: 2671-2458 PT Time Calculation (min) (ACUTE ONLY): 19 min   Charges:   PT Evaluation $PT Eval Low Complexity: 1 Low PT Treatments $Gait Training: 8-22 mins PT General Charges $$ ACUTE PT VISIT: 1 Visit         Amparo Balk, PT, DPT, GCS [phone]   Adaysha Dubinsky 05/27/2023, 11:53 AM

## 2023-05-27 NOTE — Discharge Summary (Signed)
 Sarah Tapia WUJ:[id number] DOB: 31-May-1955 DOA: 05/26/2023  PCP: Rory Collard, MD  Admit date: 05/26/2023 Discharge date: 05/27/2023  Time spent: 35 minutes  Recommendations for Outpatient Follow-up:  Pcp f/u 1 week, consider resuming amlodipine  then     Discharge Diagnoses:  Principal Problem:   Syncope Active Problems:   Hypertension   Diarrhea   Discharge Condition: improved  Diet recommendation: regular  Filed Weights   05/26/23 0917  Weight: 58.5 kg    History of present illness:  From admission h and p  Sarah Tapia is a 68 y.o. female with medical history significant for htn, recent gallstone pancreatitis s/p ercp and cholecystectomy, presenting with the above.   Yesterday developed epigastric abd pain as well as non-bloody diarrhea, also nauseaus but no vomiting. Po has been poor. No fever, no chest pain, no cough, no dysuria. Felt unwell, daughter alerted ems. While they were on their way syncopized and shook after that, daughter witnessed this, no trauma. Since then has had twitching around her mouth. Syncopized again when EMS arrived. No history of any of this  Hospital Course:   Recent cholecystectomy for gallstone pancreatitis, presenting with abdominal pain, diarrhea, and several brief episodes of syncope. Here she was evaluated with labs (unremarkable), EKG (unremarkable), and CT of the abdomen/pelvis (also unremarkable). She has not stooled while here so no stool studies were sent. She was treated with IV fluids. On hospital day one her abdominal pain is resolved, she ambulated well with physical therapy, her orthostatic vital signs are negative, and she is feeling well with no additional syncopal episodes. It appears the patient had a non-specific gastrointestinal illness that is resolving. Advise holding home amlodipine  (BPs here low normal off that med), pushing fluids, and following up with pcp within about a week.   Procedures: none    Consultations: none  Discharge Exam: Vitals:   05/27/23 0026 05/27/23 0849  BP: 99/61 133/81  Pulse: (!) 57 63  Resp:  18  Temp: 97.8 F (36.6 C) 98.5 F (36.9 C)  SpO2: 95% 98%    General: NAD Cardiovascular: RRR Respiratory: CTAB Abdomen: soft, non-tender  Discharge Instructions   Discharge Instructions     Diet general   Complete by: As directed    Increase activity slowly   Complete by: As directed       Allergies as of 05/27/2023       Reactions   Latex Hives   Metoprolol  Hives   Penicillins Hives, Itching   Ivp Dye [iodinated Contrast Media] Hives, Itching        Medication List     PAUSE taking these medications    amLODipine  5 MG tablet Wait to take this until your doctor or other care provider tells you to start again. Commonly known as: NORVASC  Take 5 mg by mouth daily.       STOP taking these medications    citalopram 20 MG tablet Commonly known as: CELEXA   escitalopram  10 MG tablet Commonly known as: LEXAPRO    oxyCODONE  5 MG immediate release tablet Commonly known as: Oxy IR/ROXICODONE    pantoprazole  40 MG tablet Commonly known as: PROTONIX        TAKE these medications    albuterol  108 (90 Base) MCG/ACT inhaler Commonly known as: VENTOLIN  HFA Inhale 1-2 puffs into the lungs every 6 (six) hours as needed for wheezing or shortness of breath.   ALPRAZolam  0.5 MG tablet Commonly known as: XANAX  Take 0.5 mg by mouth daily as needed for anxiety.  multivitamin with minerals Tabs tablet Take 1 tablet by mouth daily.   ondansetron  4 MG disintegrating tablet Commonly known as: ZOFRAN -ODT Take 1 tablet (4 mg total) by mouth every 8 (eight) hours as needed for nausea or vomiting.   Vitamin D -3 25 MCG (1000 UT) Caps Take 1,000 Units by mouth daily.   zolpidem 5 MG tablet Commonly known as: AMBIEN Take 5 mg by mouth at bedtime as needed for sleep.       Allergies  Allergen Reactions   Latex Hives   Metoprolol   Hives   Penicillins Hives and Itching   Ivp Dye [Iodinated Contrast Media] Hives and Itching    Follow-up Information     Rory Collard, MD Follow up.   Specialty: Family Medicine Why: in approximately 1 week Contact information: [address] [place] [address] [phone]                  The results of significant diagnostics from this hospitalization (including imaging, microbiology, ancillary and laboratory) are listed below for reference.    Significant Diagnostic Studies: CT ABDOMEN PELVIS W CONTRAST Result Date: 05/26/2023 CLINICAL DATA:  Postoperative abdominal pain. Recent cholecystectomy. EXAM: CT ABDOMEN AND PELVIS WITH CONTRAST TECHNIQUE: Multidetector CT imaging of the abdomen and pelvis was performed using the standard protocol following bolus administration of intravenous contrast. RADIATION DOSE REDUCTION: This exam was performed according to the departmental dose-optimization program which includes automated exposure control, adjustment of the mA and/or kV according to patient size and/or use of iterative reconstruction technique. CONTRAST:  100mL OMNIPAQUE IOHEXOL 300 MG/ML  SOLN COMPARISON:  CT abdomen pelvis dated 05/02/2023. FINDINGS: Lower chest: Minimal bibasilar linear atelectasis/scarring. No intra-abdominal free air or free fluid. Hepatobiliary: Subcentimeter hypodense lesion in the inferior right lobe of the liver is too small to characterize, possibly a cyst or hemangioma. The liver is otherwise unremarkable. There is biliary ductal dilatation, post cholecystectomy. No retained calcified stone noted in the central CBD. Trace subhepatic fluid along the cholecystectomy bed, likely postoperative seroma. No fluid collection or abscess. Pancreas: Unremarkable. No pancreatic ductal dilatation or surrounding inflammatory changes. Spleen: Normal in size without focal abnormality. Adrenals/Urinary Tract: The adrenal glands are unremarkable. There is no  hydronephrosis on either side. There is symmetric enhancement and excretion of contrast by both kidneys. The visualized ureters and urinary bladder appear unremarkable. Stomach/Bowel: There is a small hiatal hernia. There is no bowel obstruction or active inflammation. The appendix is normal. Vascular/Lymphatic: The abdominal aorta and IVC are unremarkable. No portal gas. There is no adenopathy. Reproductive: Hysterectomy.  No suspicious adnexal masses. Other: None Musculoskeletal: No acute or significant osseous findings. IMPRESSION: 1. No acute intra-abdominal or pelvic pathology. 2. Biliary ductal dilatation, post cholecystectomy. No retained calcified stone noted in the central CBD. 3. No bowel obstruction. Normal appendix. Electronically Signed   By: Angus Bark M.D.   On: 05/26/2023 14:37   DG ERCP Result Date: 05/04/2023 CLINICAL DATA:  Choledocholithiasis EXAM: ERCP TECHNIQUE: Multiple spot images obtained with the fluoroscopic device and submitted for interpretation post-procedure. COMPARISON:  MRCP 05/02/2023 FINDINGS: A series of fluoroscopic spot images document endoscopic cannulation of the CBD with subsequent contrast opacification. Cystic duct is patent. Multiple nonobstructing stones in the gallbladder lumen. Limited intrahepatic biliary duct opacification and evaluation. IMPRESSION: 1. Cholelithiasis. 2. See procedure report for additional details. These images were submitted for radiologic interpretation only. Please see the procedural report for the amount of contrast and the fluoroscopy time utilized. Electronically Signed  By: Nicoletta Barrier M.D.   On: 05/04/2023 15:22   MR ABDOMEN MRCP W WO CONTAST Result Date: 05/02/2023 CLINICAL DATA:  Right upper quadrant and epigastric pain, nausea and vomiting, cholelithiasis and gallbladder wall thickening on earlier imaging EXAM: MRI ABDOMEN WITHOUT AND WITH CONTRAST (INCLUDING MRCP) TECHNIQUE: Multiplanar multisequence MR imaging of the abdomen  was performed both before and after the administration of intravenous contrast. Heavily T2-weighted images of the biliary and pancreatic ducts were obtained, and three-dimensional MRCP images were rendered by post processing. CONTRAST:  6mL GADAVIST  GADOBUTROL  1 MMOL/ML IV SOLN COMPARISON:  Ultrasound and CT 05/02/2023 FINDINGS: Lower chest: No acute pleural or parenchymal lung disease. Hepatobiliary: Multiple large gallstones are identified within the gallbladder lumen. Mild gallbladder wall thickening measuring 4 mm. No pericholecystic fluid. 0.8 cm cyst within the inferior aspect right lobe liver. Otherwise liver parenchyma is unremarkable. There is no intrahepatic biliary duct dilation. Cystic duct appears patent. The common bile duct measures up to 5 mm in diameter, with filling defects in the downstream common bile duct measuring up to 4 mm in diameter and extending approximately 2.3 cm in length, consistent with multiple choledocholithiasis. Pancreas: No mass, inflammatory changes, or other parenchymal abnormality identified. Spleen:  Within normal limits in size and appearance. Adrenals/Urinary Tract: No masses identified. No evidence of hydronephrosis. Stomach/Bowel: Visualized portions within the abdomen are unremarkable. Vascular/Lymphatic: No pathologically enlarged lymph nodes identified. No abdominal aortic aneurysm demonstrated. Other:  No free fluid.  No abdominal wall hernia. Musculoskeletal: No suspicious bone lesions identified. IMPRESSION: 1. Cholelithiasis, with borderline gallbladder wall thickening measuring 4 mm. There is no pericholecystic fluid or other inflammatory change. Findings are equivocal for acute cholecystitis. 2. Multiple filling defects within the downstream common bile duct measuring up to 4 mm in diameter, compatible with choledocholithiasis. 3. No other acute findings. Electronically Signed   By: Bobbye Burrow M.D.   On: 05/02/2023 23:52   MR 3D Recon At Scanner Result  Date: 05/02/2023 CLINICAL DATA:  Right upper quadrant and epigastric pain, nausea and vomiting, cholelithiasis and gallbladder wall thickening on earlier imaging EXAM: MRI ABDOMEN WITHOUT AND WITH CONTRAST (INCLUDING MRCP) TECHNIQUE: Multiplanar multisequence MR imaging of the abdomen was performed both before and after the administration of intravenous contrast. Heavily T2-weighted images of the biliary and pancreatic ducts were obtained, and three-dimensional MRCP images were rendered by post processing. CONTRAST:  6mL GADAVIST  GADOBUTROL  1 MMOL/ML IV SOLN COMPARISON:  Ultrasound and CT 05/02/2023 FINDINGS: Lower chest: No acute pleural or parenchymal lung disease. Hepatobiliary: Multiple large gallstones are identified within the gallbladder lumen. Mild gallbladder wall thickening measuring 4 mm. No pericholecystic fluid. 0.8 cm cyst within the inferior aspect right lobe liver. Otherwise liver parenchyma is unremarkable. There is no intrahepatic biliary duct dilation. Cystic duct appears patent. The common bile duct measures up to 5 mm in diameter, with filling defects in the downstream common bile duct measuring up to 4 mm in diameter and extending approximately 2.3 cm in length, consistent with multiple choledocholithiasis. Pancreas: No mass, inflammatory changes, or other parenchymal abnormality identified. Spleen:  Within normal limits in size and appearance. Adrenals/Urinary Tract: No masses identified. No evidence of hydronephrosis. Stomach/Bowel: Visualized portions within the abdomen are unremarkable. Vascular/Lymphatic: No pathologically enlarged lymph nodes identified. No abdominal aortic aneurysm demonstrated. Other:  No free fluid.  No abdominal wall hernia. Musculoskeletal: No suspicious bone lesions identified. IMPRESSION: 1. Cholelithiasis, with borderline gallbladder wall thickening measuring 4 mm. There is no pericholecystic fluid or other inflammatory change.  Findings are equivocal for acute  cholecystitis. 2. Multiple filling defects within the downstream common bile duct measuring up to 4 mm in diameter, compatible with choledocholithiasis. 3. No other acute findings. Electronically Signed   By: Bobbye Burrow M.D.   On: 05/02/2023 23:52   CT ABDOMEN PELVIS WO CONTRAST Result Date: 05/02/2023 CLINICAL DATA:  Epigastric pain radiating to back, nausea and vomiting for 3 days EXAM: CT ABDOMEN AND PELVIS WITHOUT CONTRAST TECHNIQUE: Multidetector CT imaging of the abdomen and pelvis was performed following the standard protocol without IV contrast. Recommend follow up pre and post contrast MRI/MRCP or pancreatic protocol CT in 1 year. This recommendation follows ACR consensus guidelines: Management of Incidental Pancreatic Cysts: A White Paper of the ACR Incidental Findings Committee. J Am Coll Radiol 2017;14:911-923. RADIATION DOSE REDUCTION: This exam was performed according to the departmental dose-optimization program which includes automated exposure control, adjustment of the mA and/or kV according to patient size and/or use of iterative reconstruction technique. COMPARISON:  05/02/2023 ultrasound FINDINGS: Lower chest: No acute pleural or parenchymal lung disease. Hepatobiliary: Gallbladder is moderately distended, with no evidence of calcified gallstones. Gallbladder wall is thickened measuring up to 5 mm. Please see preceding right upper quadrant ultrasound report describing shadowing gallstones. Unremarkable unenhanced appearance of the liver. No biliary duct dilation. Pancreas: Unremarkable unenhanced appearance. Spleen: Unremarkable unenhanced appearance. Adrenals/Urinary Tract: No urinary tract calculi or obstructive uropathy within either kidney. The adrenals and bladder are unremarkable. Stomach/Bowel: No bowel obstruction or ileus. Normal appendix right lower quadrant. No bowel wall thickening or inflammatory change. Diverticulum off the gastric fundus. Vascular/Lymphatic: No significant  vascular findings are present. No enlarged abdominal or pelvic lymph nodes. Reproductive: Status post hysterectomy. No adnexal masses. Other: No free fluid or free intraperitoneal gas. No abdominal wall hernia. Musculoskeletal: No acute or destructive bony abnormalities. Reconstructed images demonstrate no additional findings. IMPRESSION: 1. Gallbladder wall thickening. The shadowing gallstones seen by ultrasound are not visualized by CT. The appearance of the gallbladder could suggest acute cholecystitis, and correlation with clinical presentation and laboratory evaluation is recommended. 2. No urinary tract calculi or obstructive uropathy. 3. Normal appendix. Electronically Signed   By: Bobbye Burrow M.D.   On: 05/02/2023 20:22   US  Abdomen Limited RUQ (LIVER/GB) Result Date: 05/02/2023 CLINICAL DATA:  Right upper quadrant pain for 1 day EXAM: ULTRASOUND ABDOMEN LIMITED RIGHT UPPER QUADRANT COMPARISON:  None Available. FINDINGS: Gallbladder: Multiple shadowing gallstones are identified, largest measuring 1.7 cm in size. Gallbladder wall is thickened measuring up to 6 mm. Negative sonographic Murphy sign. No pericholecystic fluid. Common bile duct: Diameter: 5 mm Liver: No focal lesion identified. Within normal limits in parenchymal echogenicity. Portal vein is patent on color Doppler imaging with normal direction of blood flow towards the liver. Other: None. IMPRESSION: 1. Cholelithiasis, with gallbladder wall thickening measuring up to 6 mm. This is consistent with acute cholecystitis. If further imaging is desired, nuclear medicine hepatobiliary scan could be considered. Electronically Signed   By: Bobbye Burrow M.D.   On: 05/02/2023 20:15    Microbiology: Recent Results (from the past 240 hours)  Resp panel by RT-PCR (RSV, Flu A&B, Covid) Anterior Nasal Swab     Status: None   Collection Time: 05/26/23 10:30 AM   Specimen: Anterior Nasal Swab  Result Value Ref Range Status   SARS Coronavirus 2 by  RT PCR NEGATIVE NEGATIVE Final    Comment: (NOTE) SARS-CoV-2 target nucleic acids are NOT DETECTED.  The SARS-CoV-2 RNA is generally detectable in  upper respiratory specimens during the acute phase of infection. The lowest concentration of SARS-CoV-2 viral copies this assay can detect is 138 copies/mL. A negative result does not preclude SARS-Cov-2 infection and should not be used as the sole basis for treatment or other patient management decisions. A negative result may occur with  improper specimen collection/handling, submission of specimen other than nasopharyngeal swab, presence of viral mutation(s) within the areas targeted by this assay, and inadequate number of viral copies(<138 copies/mL). A negative result must be combined with clinical observations, patient history, and epidemiological information. The expected result is Negative.  Fact Sheet for Patients:  BloggerCourse.com  Fact Sheet for Healthcare Providers:  SeriousBroker.it  This test is no t yet approved or cleared by the [place]  FDA and  has been authorized for detection and/or diagnosis of SARS-CoV-2 by FDA under an Emergency Use Authorization (EUA). This EUA will remain  in effect (meaning this test can be used) for the duration of the COVID-19 declaration under Section 564(b)(1) of the Act, 21 U.S.C.section 360bbb-3(b)(1), unless the authorization is terminated  or revoked sooner.       Influenza A by PCR NEGATIVE NEGATIVE Final   Influenza B by PCR NEGATIVE NEGATIVE Final    Comment: (NOTE) The Xpert Xpress SARS-CoV-2/FLU/RSV plus assay is intended as an aid in the diagnosis of influenza from Nasopharyngeal swab specimens and should not be used as a sole basis for treatment. Nasal washings and aspirates are unacceptable for Xpert Xpress SARS-CoV-2/FLU/RSV testing.  Fact Sheet for Patients: BloggerCourse.com  Fact Sheet  for Healthcare Providers: SeriousBroker.it  This test is not yet approved or cleared by the [place]  FDA and has been authorized for detection and/or diagnosis of SARS-CoV-2 by FDA under an Emergency Use Authorization (EUA). This EUA will remain in effect (meaning this test can be used) for the duration of the COVID-19 declaration under Section 564(b)(1) of the Act, 21 U.S.C. section 360bbb-3(b)(1), unless the authorization is terminated or revoked.     Resp Syncytial Virus by PCR NEGATIVE NEGATIVE Final    Comment: (NOTE) Fact Sheet for Patients: BloggerCourse.com  Fact Sheet for Healthcare Providers: SeriousBroker.it  This test is not yet approved or cleared by the [place]  FDA and has been authorized for detection and/or diagnosis of SARS-CoV-2 by FDA under an Emergency Use Authorization (EUA). This EUA will remain in effect (meaning this test can be used) for the duration of the COVID-19 declaration under Section 564(b)(1) of the Act, 21 U.S.C. section 360bbb-3(b)(1), unless the authorization is terminated or revoked.  Performed at [facility], [address] [place], [place] [address]   Culture, blood (Routine X 2) w Reflex to ID Panel     Status: None (Preliminary result)   Collection Time: 05/26/23  7:53 PM   Specimen: BLOOD  Result Value Ref Range Status   Specimen Description BLOOD BLOOD LEFT HAND  Final   Special Requests   Final    BOTTLES DRAWN AEROBIC AND ANAEROBIC Blood Culture results may not be optimal due to an inadequate volume of blood received in culture bottles   Culture   Final    NO GROWTH < 12 HOURS Performed at [facility], [address] [place], [place] [address]    Report Status PENDING  Incomplete  Culture, blood (Routine X 2) w Reflex to ID Panel     Status: None (Preliminary result)   Collection Time: 05/26/23  7:55 PM   Specimen:  BLOOD  Result Value Ref  Range Status   Specimen Description BLOOD BLOOD RIGHT HAND  Final   Special Requests   Final    BOTTLES DRAWN AEROBIC AND ANAEROBIC Blood Culture adequate volume   Culture   Final    NO GROWTH < 12 HOURS Performed at [facility], [address] [place], [place] [address]    Report Status PENDING  Incomplete     Labs: Basic Metabolic Panel: Recent Labs  Lab 05/26/23 0930 05/27/23 0259  NA 143 140  K 3.8 3.9  CL 106 105  CO2 27 27  GLUCOSE 121* 96  BUN 7* 12  CREATININE 0.74 0.82  CALCIUM  8.3* 8.8*   Liver Function Tests: Recent Labs  Lab 05/26/23 0930  AST 20  ALT 22  ALKPHOS 67  BILITOT 1.0  PROT 6.9  ALBUMIN 3.5   Recent Labs  Lab 05/26/23 0930  LIPASE 35   No results for input(s): "AMMONIA" in the last 168 hours. CBC: Recent Labs  Lab 05/26/23 0930 05/27/23 0259  WBC 6.3 12.6*  NEUTROABS 4.2  --   HGB 12.0 12.1  HCT 37.9 38.2  MCV 85.6 86.2  PLT 289 295   Cardiac Enzymes: No results for input(s): "CKTOTAL", "CKMB", "CKMBINDEX", "TROPONINI" in the last 168 hours. BNP: BNP (last 3 results) No results for input(s): "BNP" in the last 8760 hours.  ProBNP (last 3 results) No results for input(s): "PROBNP" in the last 8760 hours.  CBG: No results for input(s): "GLUCAP" in the last 168 hours.     Signed:  Raymonde Calico MD.  [facility] 05/27/2023, 11:51 AM

## 2023-05-31 LAB — CULTURE, BLOOD (ROUTINE X 2)
Culture: NO GROWTH
Culture: NO GROWTH
Special Requests: ADEQUATE

## 2023-06-16 DIAGNOSIS — F419 Anxiety disorder, unspecified: Secondary | ICD-10-CM | POA: Diagnosis not present

## 2023-06-16 DIAGNOSIS — Z9049 Acquired absence of other specified parts of digestive tract: Secondary | ICD-10-CM | POA: Diagnosis not present

## 2023-06-16 DIAGNOSIS — I10 Essential (primary) hypertension: Secondary | ICD-10-CM | POA: Diagnosis not present

## 2023-06-20 DIAGNOSIS — E538 Deficiency of other specified B group vitamins: Secondary | ICD-10-CM | POA: Diagnosis not present

## 2023-08-05 DIAGNOSIS — R42 Dizziness and giddiness: Secondary | ICD-10-CM | POA: Diagnosis not present

## 2023-08-05 DIAGNOSIS — Z8659 Personal history of other mental and behavioral disorders: Secondary | ICD-10-CM | POA: Diagnosis not present

## 2023-08-05 DIAGNOSIS — I10 Essential (primary) hypertension: Secondary | ICD-10-CM | POA: Diagnosis not present

## 2023-08-05 DIAGNOSIS — F419 Anxiety disorder, unspecified: Secondary | ICD-10-CM | POA: Diagnosis not present

## 2023-08-05 DIAGNOSIS — Z8679 Personal history of other diseases of the circulatory system: Secondary | ICD-10-CM | POA: Diagnosis not present

## 2023-08-05 DIAGNOSIS — R55 Syncope and collapse: Secondary | ICD-10-CM | POA: Diagnosis not present

## 2023-08-05 DIAGNOSIS — M810 Age-related osteoporosis without current pathological fracture: Secondary | ICD-10-CM | POA: Diagnosis not present

## 2023-08-05 DIAGNOSIS — R251 Tremor, unspecified: Secondary | ICD-10-CM | POA: Diagnosis not present

## 2023-08-05 DIAGNOSIS — J45909 Unspecified asthma, uncomplicated: Secondary | ICD-10-CM | POA: Diagnosis not present

## 2023-08-05 NOTE — Progress Notes (Signed)
 Ref Provider: Glover Alm Hail* PCP: Glover Alm Hail, MD Assessment and Plan:   In most patients we give written parts of assessment and plan to patient under Patient Instructions/After Visit Summary. So some parts are directed to patient.  Dear [name], It was our pleasure to participate in your care in person. We have typed up brief summary of what we discussed Assessment & Plan Dizziness and syncope Intermittent dizziness and syncope with differentials considered  such as medications, inner ear issues, cardiac-related issues, dehydration, migraines.and seizures. Patient does have history of SVT and is managed by cardiology.  - Order vestibular rehabilitation therapy. - Order 3-night, at- home sleep study to rule out sleep apnea (SNAP) - Order routine, sleep-deprived EEG to evaluate for seizures. - Order MRI of the brain without contrast  - Order Magnesium , phosphorus, Vitamin B6  - Magnesium : Recommend beginning Magnesium  citrate or glycinate supplements 400-600 mg per day. Magnesium  can be used as preventive treatment for migraine and other types of headaches. It has many benefits of relatively side-effect free, safe in pregnancy, relatively inexpensive, highly effective in some patients. Magnesium  citrate is used typically 400-600 mg/day. It can cause diarrhea in some patients. Magnesium  glycinate is better tolerated by GI system but it is hard to find (may have to look up online or health food store). We gave printed material if needed.  Tremor Intermittent tremor possibly related to anxiety or other neurological issues.  Anxiety Anxiety managed with Xanax  as needed. Xanax  use should be monitored due to potential contribution to dizziness. - Monitor Xanax  use and avoid concurrent use with Ambien  History of Supraventricular tachycardia (SVT) History of SVT with planned electrophysiology consult. Recent episodes of syncope may be cardiac related  - Continue  follow-up with cardiology. - Proceed with electrophysiology consult.  Hypertension Hypertension managed with amlodipine . Blood pressure medication may contribute to dizziness. - Continue current hypertension management. - Monitor blood pressure.  Depression Depression managed with citalopram . Awaiting follow-up with a new psychiatrist for ongoing management. - Continue citalopram . - Follow up with psychiatry for ongoing management.  Osteoporosis Osteoporosis managed with Reclast annually.  Asthma Asthma with seasonal exacerbations, managed with albuterol  inhaler as needed.    Follow up in 3 months with Dr. Jannett Fairly   No follow-ups on file. This note has been created using automated tools and reviewed for accuracy by MISTIE NELSON.  History and Present Illness:   [name] is a 68 y.o. female  here for evaluation of dizziness.   History of Present Illness Sarah Tapia is a 68 year old female who presents with dizziness, lightheadedness, and imbalance. She is accompanied by her daughter. She was referred by her primary care doctor for follow-up on her dizziness and imbalance.  She experiences dizziness, lightheadedness, and imbalance for several weeks, with episodes lasting hours. These symptoms sometimes require bed rest due to fear of falling. Syncope has occurred, with one episode involving eye-rolling and lip shimmering. Dizziness occurs while sitting or standing, sometimes with nausea and headaches, which can occur independently of dizziness. No tinnitus or ear symptoms are present.  She was hospitalized on May 26, 2023, for brief syncope episodes and received IV fluids. She underwent a cholecystectomy for gallstone pancreatitis prior to these episodes. Dizziness began a few months ago with varying intensity.  Current medications include Xanax , Ambien, albuterol , amlodipine , citalopram , vitamin B12, vitamin D , Zofran , pantoprazole , and Reclast. She has been taking magnesium   for the past three to four weeks.  She has  vitamin D  deficiency, prediabetes, and vitamin B12 deficiency, receiving monthly B12 shots. Her blood count was slightly low recently. She has anxiety and depression and is awaiting a new psychiatry appointment.  I reviewed labs, imaging, and notes in [place], [place], and from outside providers, if available.    General Exam:  There were no vitals filed for this visit.  There is no height or weight on file to calculate BMI.  Neurological exam appropriate for the patient's condition was performed.  Physical Exam CHEST: Lungs clear to auscultation. CARDIOVASCULAR: Heart sounds normal, S2 normal, regular rate and rhythm. NEUROLOGICAL: Pupils equal, round, reactive to light and accommodation. Extraocular movements intact, no nystagmus. Brachioradialis and bicep reflexes 2+ bilaterally. Knee jerk reflexes 2+ bilaterally. Nasolabial fold less prominent on left side compared to right.  Below information was reviewed by me.  Social Drivers of Health   Tobacco Use: Low Risk  (06/05/2023)   Patient History   . Smoking Tobacco Use: Never   . Smokeless Tobacco Use: Never   . Passive Exposure: Not on file  Alcohol Use: Not on file  Financial Resource Strain: Patient Declined (09/23/2022)   Overall Financial Resource Strain (CARDIA)   . Difficulty of Paying Living Expenses: Patient declined  Food Insecurity: Unknown (05/26/2023)   Received from [facility]   Hunger Vital Sign   . Within the past 12 months, you worried that your food would run out before you got the money to buy more.: Never true   . Within the past 12 months, the food you bought just didn't last and you didn't have money to get more.: Patient declined  Transportation Needs: No Transportation Needs (05/26/2023)   Received from [facility] - Transportation   . Lack of Transportation (Medical): No   . Lack of Transportation (Non-Medical): No  Physical Activity: Not on  file  Stress: Not on file  Social Connections: Moderately Isolated (05/26/2023)   Received [facility]   Social Connection and Isolation Panel   . In a typical week, how many times do you talk on the phone with family, friends, or neighbors?: Once a week   . How often do you get together with friends or relatives?: Once a week   . How often do you attend church or religious services?: 1 to 4 times per year   . Do you belong to any clubs or organizations such as church groups, unions, fraternal or athletic groups, or school groups?: No   . How often do you attend meetings of the clubs or organizations you belong to?: 1 to 4 times per year   . Are you married, widowed, divorced, separated, never married, or living with a partner?: Widowed  Depression: Not at risk (09/23/2022)   PHQ-2   . PHQ-2 Score: 0  Housing Stability: Unknown (05/03/2023)   Received from [facility] Stability Vital Sign   . Unable to Pay for Housing in the Last Year: No   . Number of Times Moved in the Last Year: Not on file   . Homeless in the Last Year: No  Utilities: Not At Risk (05/26/2023)   Received from [facility] Utilities   . Threatened with loss of utilities: No  Health Literacy: Not on file    Medications: Current Outpatient Medications on File Prior to Visit  Medication Sig Dispense Refill  . albuterol  MDI, PROVENTIL , VENTOLIN , PROAIR , HFA 90 mcg/actuation inhaler INHALE 1 PUFF INTO THE LUNGS  EVERY 4 HOURS AS NEEDED FOR WHEEZING. 18 each 1  . ALPRAZolam  (XANAX ) 0.5 MG tablet TAKE ONE TABLET BY MOUTH ONCE DAILY AS NEEDED FOR ANXIETY OR SLEEP 10 tablet 0  . amLODIPine  (NORVASC ) 10 MG tablet TAKE 1 TABLET BY MOUTH EVERY DAY 90 tablet 3  . amLODIPine  (NORVASC ) 10 MG tablet Take 1 tablet (10 mg total) by mouth once daily 30 tablet 3  . citalopram  (CELEXA ) 20 MG tablet Take 20 mg by mouth once daily    . cyanocobalamin  (VITAMIN B12) 1000 MCG tablet Take 1,000 mcg by mouth once daily    .  ergocalciferol , vitamin D2, 1,250 mcg (50,000 unit) capsule Take 1 capsule (50,000 Units total) by mouth twice a week 8 capsule 12  . multivitamin tablet Take 1 tablet by mouth once daily.    . ondansetron  (ZOFRAN -ODT) 4 MG disintegrating tablet Take 1 tablet (4 mg total) by mouth every 8 (eight) hours as needed for Nausea 25 tablet 0  . pantoprazole  (PROTONIX ) 40 MG DR tablet TAKE 1 TABLET BY MOUTH TWICE A DAY 60 tablet 1  . WIXELA INHUB 250-50 mcg/dose diskus inhaler INHALE 1 INHALATION INTO THE LUNGS EVERY 12 HOURS 60 each 1  . zoledronic acid (RECLAST) 5 mg/100 mL injection Inject 5 mg into the vein once    . zolpidem (AMBIEN) 5 MG tablet TAKE ONE TABLET BY MOUTH AT BEDTIME AS NEEDED FOR SLEEP. DON'T TAKE EVERY NIGHT. MUST LAST 30 DAYS. 10 tablet 0   Current Facility-Administered Medications on File Prior to Visit  Medication Dose Route Frequency Provider Last Rate Last Admin  . cyanocobalamin  (VITAMIN B12) injection 1,000 mcg  1,000 mcg Intramuscular Q30 Days Glover Alm Hail, MD   1,000 mcg at 06/20/23 0900     Past Medical History:  Past Medical History:  Diagnosis Date  . Anemia, unspecified   . Anxiety   . Arrhythmia   . Constipation 10/04/2014  . Depression   . DJD (degenerative joint disease)   . Esophageal reflux   . History of Barrett's esophagus   . History of Barrett's esophagus 10/04/2014  . Hypercholesterolemia   . Hypertension   . Myocardial infarction (CMS/HHS-HCC)   . Other symptoms involving digestive system(787.99)   . Senile osteoporosis    a. Vitamin D  deficiency.  b. Reclast.  . Visit for monitoring Reclast therapy   . Vitamin D  deficiency     Past Surgical History:  Past Surgical History:  Procedure Laterality Date  . HYSTERECTOMY  11/1998   with left oophorectomy  . HYOID MYOTOMY & SUSPENSION  11/08/2005  . EGD  07/17/2006  . COLONOSCOPY N/A 08/02/2008   Dr. FABIENE Holmes @ [facility] - [facility]CC(f(m)(b): [facility] 07/2011  . COLONOSCOPY N/A 11/15/2014   Dr.  FABIENE Holmes @ [facility] - Diverticulosis, Int. Hemorrh., FHCC(f)(m)(b), [facility] 10/2019  . EGD N/A 11/15/2014   Dr. FABIENE Holmes @ [facility] - No repeat per RTE   Family History:  Family History  Problem Relation Name Age of Onset  . Colon cancer Mother    . High blood pressure (Hypertension) Mother    . Diabetes type II Mother    . Pancreatic cancer Mother  83  . Diabetes Mother    . Diabetes type II Father    . High blood pressure (Hypertension) Father    . Myocardial Infarction (Heart attack) Father    . Colon cancer Father    . Diabetes Father    . Diabetes Sister    . Colon cancer  Brother  45  . Diabetes Brother    . Diabetes type II Maternal Grandmother    . Stroke Maternal Grandmother    . Colon cancer Maternal Aunt    . Pancreatic cancer Maternal Aunt  80  . Myocardial Infarction (Heart attack) Paternal Uncle     Social History:  Social History   Socioeconomic History  . Marital status: Married  Tobacco Use  . Smoking status: Never  . Smokeless tobacco: Never  Vaping Use  . Vaping status: Never Used  Substance and Sexual Activity  . Alcohol use: No  . Drug use: No  . Sexual activity: Never   Social Drivers of Health   Financial Resource Strain: Patient Declined (09/23/2022)   Overall Financial Resource Strain (CARDIA)   . Difficulty of Paying Living Expenses: Patient declined  Food Insecurity: Unknown (05/26/2023)   Received from [facility]   Hunger Vital Sign   . Within the past 12 months, you worried that your food would run out before you got the money to buy more.: Never true   . Within the past 12 months, the food you bought just didn't last and you didn't have money to get more.: Patient declined  Transportation Needs: No Transportation Needs (05/26/2023)   Received from [facility] - Transportation   . Lack of Transportation (Medical): No   . Lack of Transportation (Non-Medical): No  Social Connections: Moderately Isolated (05/26/2023)   Received  from [facility]   Social Connection and Isolation Panel   . In a typical week, how many times do you talk on the phone with family, friends, or neighbors?: Once a week   . How often do you get together with friends or relatives?: Once a week   . How often do you attend church or religious services?: 1 to 4 times per year   . Do you belong to any clubs or organizations such as church groups, unions, fraternal or athletic groups, or school groups?: No   . How often do you attend meetings of the clubs or organizations you belong to?: 1 to 4 times per year   . Are you married, widowed, divorced, separated, never married, or living with a partner?: Widowed  Housing Stability: Unknown (05/03/2023)   Received from [facility] Stability Vital Sign   . Unable to Pay for Housing in the Last Year: No   . Homeless in the Last Year: No   Allergies:  Allergies  Allergen Reactions  . Metoprolol  Hives  . Benadryl  [Diphenhydramine  Hcl] Palpitations  . Latex Other (See Comments)    Other Reaction: red skin  . Levaquin [Levofloxacin] Abdominal Pain  . Metoprolol  Succinate Hives  . Penicillins Rash  . Iodinated Contrast Media Hives and Itching   This note has been created using automated tools and reviewed for accuracy by ELIAS GREGORY RODRIGUEZ.   Attestation Statement:   I personally performed the service, non-incident to. (WP)   ELIAS CORDELLA STALLION, NP

## 2023-08-06 DIAGNOSIS — F1320 Sedative, hypnotic or anxiolytic dependence, uncomplicated: Secondary | ICD-10-CM | POA: Diagnosis not present

## 2023-08-06 DIAGNOSIS — Z5181 Encounter for therapeutic drug level monitoring: Secondary | ICD-10-CM | POA: Diagnosis not present

## 2023-08-07 ENCOUNTER — Other Ambulatory Visit: Payer: Self-pay | Admitting: Neurology

## 2023-08-07 DIAGNOSIS — R42 Dizziness and giddiness: Secondary | ICD-10-CM

## 2023-08-07 DIAGNOSIS — R55 Syncope and collapse: Secondary | ICD-10-CM

## 2023-08-07 DIAGNOSIS — E538 Deficiency of other specified B group vitamins: Secondary | ICD-10-CM | POA: Diagnosis not present

## 2023-08-10 ENCOUNTER — Ambulatory Visit
Admission: RE | Admit: 2023-08-10 | Discharge: 2023-08-10 | Disposition: A | Discharge status: Home / Self Care | Source: Ambulatory Visit | Attending: Neurology | Admitting: Neurology

## 2023-08-10 DIAGNOSIS — R42 Dizziness and giddiness: Secondary | ICD-10-CM | POA: Insufficient documentation

## 2023-08-10 DIAGNOSIS — R55 Syncope and collapse: Secondary | ICD-10-CM | POA: Diagnosis not present

## 2023-08-10 DIAGNOSIS — R519 Headache, unspecified: Secondary | ICD-10-CM | POA: Diagnosis not present

## 2023-08-13 DIAGNOSIS — R9431 Abnormal electrocardiogram [ECG] [EKG]: Secondary | ICD-10-CM | POA: Diagnosis not present

## 2023-08-13 DIAGNOSIS — I259 Chronic ischemic heart disease, unspecified: Secondary | ICD-10-CM | POA: Diagnosis not present

## 2023-08-19 DIAGNOSIS — G4730 Sleep apnea, unspecified: Secondary | ICD-10-CM | POA: Diagnosis not present

## 2023-09-04 DIAGNOSIS — R7303 Prediabetes: Secondary | ICD-10-CM | POA: Diagnosis not present

## 2023-09-04 DIAGNOSIS — M810 Age-related osteoporosis without current pathological fracture: Secondary | ICD-10-CM | POA: Diagnosis not present

## 2023-09-04 DIAGNOSIS — E559 Vitamin D deficiency, unspecified: Secondary | ICD-10-CM | POA: Diagnosis not present

## 2023-09-05 DIAGNOSIS — E538 Deficiency of other specified B group vitamins: Secondary | ICD-10-CM | POA: Diagnosis not present

## 2023-10-03 DIAGNOSIS — E538 Deficiency of other specified B group vitamins: Secondary | ICD-10-CM | POA: Diagnosis not present

## 2023-10-17 DIAGNOSIS — S99922A Unspecified injury of left foot, initial encounter: Secondary | ICD-10-CM | POA: Diagnosis not present

## 2023-10-27 DIAGNOSIS — M79672 Pain in left foot: Secondary | ICD-10-CM | POA: Diagnosis not present

## 2023-10-27 DIAGNOSIS — S92515A Nondisplaced fracture of proximal phalanx of left lesser toe(s), initial encounter for closed fracture: Secondary | ICD-10-CM | POA: Diagnosis not present

## 2023-10-28 ENCOUNTER — Other Ambulatory Visit: Payer: Self-pay

## 2023-10-28 ENCOUNTER — Emergency Department

## 2023-10-28 ENCOUNTER — Emergency Department
Admission: EM | Admit: 2023-10-28 | Discharge: 2023-10-28 | Disposition: A | Discharge status: Home / Self Care | Attending: Emergency Medicine | Admitting: Emergency Medicine

## 2023-10-28 DIAGNOSIS — Z8673 Personal history of transient ischemic attack (TIA), and cerebral infarction without residual deficits: Secondary | ICD-10-CM | POA: Diagnosis not present

## 2023-10-28 DIAGNOSIS — R55 Syncope and collapse: Secondary | ICD-10-CM | POA: Insufficient documentation

## 2023-10-28 DIAGNOSIS — I10 Essential (primary) hypertension: Secondary | ICD-10-CM | POA: Diagnosis not present

## 2023-10-28 DIAGNOSIS — M6281 Muscle weakness (generalized): Secondary | ICD-10-CM | POA: Diagnosis present

## 2023-10-28 DIAGNOSIS — R29898 Other symptoms and signs involving the musculoskeletal system: Secondary | ICD-10-CM | POA: Diagnosis not present

## 2023-10-28 DIAGNOSIS — R42 Dizziness and giddiness: Secondary | ICD-10-CM | POA: Diagnosis not present

## 2023-10-28 DIAGNOSIS — R519 Headache, unspecified: Secondary | ICD-10-CM | POA: Diagnosis present

## 2023-10-28 DIAGNOSIS — R531 Weakness: Secondary | ICD-10-CM

## 2023-10-28 DIAGNOSIS — M810 Age-related osteoporosis without current pathological fracture: Secondary | ICD-10-CM | POA: Insufficient documentation

## 2023-10-28 DIAGNOSIS — Z79899 Other long term (current) drug therapy: Secondary | ICD-10-CM | POA: Insufficient documentation

## 2023-10-28 DIAGNOSIS — R29818 Other symptoms and signs involving the nervous system: Secondary | ICD-10-CM | POA: Diagnosis not present

## 2023-10-28 DIAGNOSIS — R079 Chest pain, unspecified: Secondary | ICD-10-CM | POA: Diagnosis not present

## 2023-10-28 DIAGNOSIS — Z91040 Latex allergy status: Secondary | ICD-10-CM | POA: Diagnosis not present

## 2023-10-28 DIAGNOSIS — R29810 Facial weakness: Secondary | ICD-10-CM | POA: Diagnosis not present

## 2023-10-28 LAB — CBC
HCT: 40.7 % (ref 36.0–46.0)
Hemoglobin: 12.7 g/dL (ref 12.0–15.0)
MCH: 26.8 pg (ref 26.0–34.0)
MCHC: 31.2 g/dL (ref 30.0–36.0)
MCV: 85.9 fL (ref 80.0–100.0)
Platelets: 240 K/uL (ref 150–400)
RBC: 4.74 MIL/uL (ref 3.87–5.11)
RDW: 13.4 % (ref 11.5–15.5)
WBC: 8 K/uL (ref 4.0–10.5)
nRBC: 0 % (ref 0.0–0.2)

## 2023-10-28 LAB — DIFFERENTIAL
Abs Immature Granulocytes: 0.01 K/uL (ref 0.00–0.07)
Basophils Absolute: 0.1 K/uL (ref 0.0–0.1)
Basophils Relative: 1 %
Eosinophils Absolute: 0.1 K/uL (ref 0.0–0.5)
Eosinophils Relative: 1 %
Immature Granulocytes: 0 %
Lymphocytes Relative: 32 %
Lymphs Abs: 2.6 K/uL (ref 0.7–4.0)
Monocytes Absolute: 0.6 K/uL (ref 0.1–1.0)
Monocytes Relative: 7 %
Neutro Abs: 4.7 K/uL (ref 1.7–7.7)
Neutrophils Relative %: 59 %

## 2023-10-28 LAB — COMPREHENSIVE METABOLIC PANEL WITH GFR
ALT: 18 U/L (ref 0–44)
AST: 27 U/L (ref 15–41)
Albumin: 3.5 g/dL (ref 3.5–5.0)
Alkaline Phosphatase: 51 U/L (ref 38–126)
Anion gap: 8 (ref 5–15)
BUN: 12 mg/dL (ref 8–23)
CO2: 29 mmol/L (ref 22–32)
Calcium: 8.9 mg/dL (ref 8.9–10.3)
Chloride: 103 mmol/L (ref 98–111)
Creatinine, Ser: 0.79 mg/dL (ref 0.44–1.00)
GFR, Estimated: >60 mL/min (ref >60)
Glucose, Bld: 101 mg/dL — ABNORMAL HIGH (ref 70–99)
Potassium: 3.8 mmol/L (ref 3.5–5.1)
Sodium: 140 mmol/L (ref 135–145)
Total Bilirubin: 1 mg/dL (ref 0.0–1.2)
Total Protein: 6.8 g/dL (ref 6.5–8.1)

## 2023-10-28 LAB — URINE DRUG SCREEN, QUALITATIVE (ARMC ONLY)
Amphetamines, Ur Screen: NOT DETECTED
Barbiturates, Ur Screen: NOT DETECTED
Benzodiazepine, Ur Scrn: NOT DETECTED
Cannabinoid 50 Ng, Ur ~~LOC~~: NOT DETECTED
Cocaine Metabolite,Ur ~~LOC~~: NOT DETECTED
MDMA (Ecstasy)Ur Screen: NOT DETECTED
Methadone Scn, Ur: NOT DETECTED
Opiate, Ur Screen: NOT DETECTED
Phencyclidine (PCP) Ur S: NOT DETECTED
Tricyclic, Ur Screen: NOT DETECTED

## 2023-10-28 LAB — ETHANOL: Alcohol, Ethyl (B): <15 mg/dL (ref <15)

## 2023-10-28 LAB — APTT: aPTT: 26 s (ref 24–36)

## 2023-10-28 LAB — TROPONIN I (HIGH SENSITIVITY): Troponin I (High Sensitivity): <2 ng/L (ref <18)

## 2023-10-28 LAB — PROTIME-INR
INR: 1.1 (ref 0.8–1.2)
Prothrombin Time: 15 s (ref 11.4–15.2)

## 2023-10-28 LAB — CBG MONITORING, ED: Glucose-Capillary: 82 mg/dL (ref 70–99)

## 2023-10-28 LAB — CK: Total CK: 65 U/L (ref 38–234)

## 2023-10-28 MED ORDER — MIDAZOLAM HCL 2 MG/2ML IJ SOLN
2.0000 mg | Freq: Once | INTRAMUSCULAR | Status: AC
  Administered 2023-10-28: 2 mg via INTRAVENOUS
  Filled 2023-10-28: qty 2

## 2023-10-28 MED ORDER — MECLIZINE HCL 25 MG PO TABS: 25.0000 mg | ORAL_TABLET | Freq: Three times a day (TID) | ORAL | 0 refills | Status: AC | PRN

## 2023-10-28 MED ORDER — MECLIZINE HCL 25 MG PO TABS
25.0000 mg | ORAL_TABLET | Freq: Once | ORAL | Status: AC
  Administered 2023-10-28: 25 mg via ORAL
  Filled 2023-10-28: qty 1

## 2023-10-28 MED ORDER — SODIUM CHLORIDE 0.9 % IV BOLUS
500.0000 mL | Freq: Once | INTRAVENOUS | Status: AC
  Administered 2023-10-28: 500 mL via INTRAVENOUS

## 2023-10-28 NOTE — Consult Note (Signed)
 NEUROLOGY CONSULT NOTE   Date of service: October 28, 2023 Patient Name: Sarah Tapia MRN:  [id number] DOB:  Jun 25, 1955 Chief Complaint: Dizziness, left-sided weakness Requesting Provider: Suzanne Kirsch, MD  History of Present Illness  Sarah Tapia is a 68 y.o. female with hx of chronic dizziness and syncopal episodes, headaches, childhood epilepsy, anxiety, depression presents for evaluation of sudden onset of dizziness followed by left-sided weakness.  Reports last known well at 10 AM and after that started feeling dizzy which she describes as both lightheadedness and room spinning.  She also had a syncopal episode and then she called her daughter who brought her to the hospital.  Code stroke was activated because she was weak on the left side.  She is also complaining of chest pain, headache, dizziness and nausea at this time.   LKW: 10 AM Modified rankin score: 0-Completely asymptomatic and back to baseline post- stroke IV Thrombolysis: Functional exam, MRI negative EVT: Functional exam, MRI negative  NIHSS components Score: Comment  1a Level of Conscious 0[x]  1[]  2[]  3[]      1b LOC Questions 0[x]  1[]  2[]       1c LOC Commands 0[x]  1[]  2[]       2 Best Gaze 0[x]  1[]  2[]       3 Visual 0[x]  1[]  2[]  3[]      4 Facial Palsy 0[x]  1[]  2[]  3[]      5a Motor Arm - left 0[]  1[x]  2[]  3[]  4[]  UN[]    5b Motor Arm - Right 0[x]  1[]  2[]  3[]  4[]  UN[]    6a Motor Leg - Left 0[]  1[x]  2[]  3[]  4[]  UN[]    6b Motor Leg - Right 0[x]  1[]  2[]  3[]  4[]  UN[]    7 Limb Ataxia 0[x]  1[]  2[]  UN[]      8 Sensory 0[]  1[x]  2[]  UN[]      9 Best Language 0[x]  1[]  2[]  3[]      10 Dysarthria 0[x]  1[]  2[]  UN[]      11 Extinct. and Inattention 0[x]  1[]  2[]       TOTAL: 3      ROS  Comprehensive ROS performed and pertinent positives documented in HPI   Past History   Past Medical History:  Diagnosis Date   Anxiety    Gastritis    GERD (gastroesophageal reflux disease)    Hypertension    Pre-diabetes     Past  Surgical History:  Procedure Laterality Date   ABDOMINAL HYSTERECTOMY     CHOLECYSTECTOMY N/A 05/05/2023   Procedure: LAPAROSCOPIC CHOLECYSTECTOMY WITH INTRAOPERATIVE CHOLANGIOGRAM;  Surgeon: Sebastian Moles, MD;  Location: [facility] OR;  Service: General;  Laterality: N/A;  POSSIBLE IOC   ERCP N/A 05/04/2023   Procedure: ERCP, WITH INTERVENTION IF INDICATED;  Surgeon: Charlanne Groom, MD;  Location: [facility] ENDOSCOPY;  Service: Gastroenterology;  Laterality: N/A;   STONE EXTRACTION WITH BASKET  05/04/2023   Procedure: ERCP,REMOVAL OF COMMON BILE DUCT CALCULUS;  Surgeon: Charlanne Groom, MD;  Location: [facility] ENDOSCOPY;  Service: Gastroenterology;;    Family History: Family History  Problem Relation Age of Onset   Breast cancer Sister 67    Social History  reports that she has never smoked. She has never used smokeless tobacco. She reports that she does not drink alcohol and does not use drugs.  Allergies  Allergen Reactions   Latex Hives   Metoprolol  Hives   Penicillins Hives and Itching   Ivp Dye [Iodinated Contrast Media] Hives and Itching    Medications  No current facility-administered medications for this encounter.  Current Outpatient Medications:  albuterol  (PROVENTIL  HFA;VENTOLIN  HFA) 108 (90 Base) MCG/ACT inhaler, Inhale 1-2 puffs into the lungs every 6 (six) hours as needed for wheezing or shortness of breath., Disp: , Rfl:    ALPRAZolam  (XANAX ) 0.5 MG tablet, Take 0.5 mg by mouth daily as needed for anxiety., Disp: , Rfl: 2   [Paused] amLODipine  (NORVASC ) 5 MG tablet, Take 5 mg by mouth daily., Disp: , Rfl:    Cholecalciferol (VITAMIN D -3) 1000 units CAPS, Take 1,000 Units by mouth daily., Disp: , Rfl:    Multiple Vitamin (MULTIVITAMIN WITH MINERALS) TABS tablet, Take 1 tablet by mouth daily., Disp: , Rfl:    ondansetron  (ZOFRAN -ODT) 4 MG disintegrating tablet, Take 1 tablet (4 mg total) by mouth every 8 (eight) hours as needed for nausea or vomiting., Disp: 12 tablet, Rfl: 0   zolpidem  (AMBIEN) 5 MG tablet, Take 5 mg by mouth at bedtime as needed for sleep., Disp: , Rfl: 0  Vitals   Vitals:   11/23/2023 1107 2023-11-23 1130  BP: (!) 182/93 (!) 163/95  Pulse: 74 72  Resp: 18 (!) 23  Temp: 98.6 F (37 C)   TempSrc: Oral   SpO2: 100% 100%  Weight: 60.8 kg   Height: 5' 3 (1.6 m)     Body mass index is 23.74 kg/m.   Physical Exam   Constitutional: Appears well-developed and well-nourished.  Psych: Affect appropriate to situation.  Eyes: No scleral injection.  HENT: No OP obstruction.  Head: Normocephalic.  Cardiovascular: Normal rate and regular rhythm.  Respiratory: Effort normal, non-labored breathing.  GI: Soft.  No distension. There is no tenderness.  Skin: WDI.   Neurologic Examination  Awake alert oriented x 3 No dysarthria No aphasia Cranial nerves II to XII: Pupils equal round react light, extraocular movements intact, visual fields full, face appears grossly symmetric, facial sensation diminished on the left with sharp cut off in the midline and sparing of vibratory sense on the forehead.  Tongue and palate midline. Motor exam with drift in the left upper and lower extremity with bobbing movements of the upper extremity while trying to keep it up. Sensation diminished on the left hemibody with a sharp cut off in the midline. Coordination examination reveals dysmetria proportional to the weakness on the left.  No dysmetria on the right. Gait testing was deferred at this time   Labs/Imaging/Neurodiagnostic studies   CBC:  Recent Labs  Lab 2023/11/23 1109  WBC 8.0  NEUTROABS 4.7  HGB 12.7  HCT 40.7  MCV 85.9  PLT 240   Basic Metabolic Panel:  Lab Results  Component Value Date   NA 140 2023/11/23   K 3.8 November 23, 2023   CO2 29 2023-11-23   GLUCOSE 101 (H) 11/23/2023   BUN 12 11-23-23   CREATININE 0.79 2023/11/23   CALCIUM  8.9 2023/11/23   GFRNONAA >60 11/23/2023   GFRAA >60 04/11/2017   HgbA1c:  Lab Results  Component Value Date    HGBA1C 5.8 04/16/2011   INR  Lab Results  Component Value Date   INR 1.1 11-23-23   CT Head without contrast(Personally reviewed): Aspects 10.  No acute findings.  CT angio Head and Neck with contrast(Personally reviewed): Not performed-allergy listed to contrast although patient says she does not remember history.  Decision made to go for stat MRI  Stat MRI brain personally reviewed-negative for stroke or acute process  ASSESSMENT   Jenalee Trevizo is a 68 y.o. female history of chronic dizziness and syncopal episodes, headaches, childhood epilepsy, anxiety, depression presenting  for evaluation of sudden onset of dizziness, chest pain and left-sided weakness. On examination, has some functional component to the exam raising concern for the weakness being nonorganic. CT head unremarkable CT angiography head and neck not performed-allergy listed although patient was not sure what the allergy was. In any case, due to the functional exam, I deemed that an MRI might be of more useful information and a stat MRI was performed-personally reviewed-no evidence of stroke.  Impression: Dizziness-not likely vascular etiology.  Has continuing workup outpatient  RECOMMENDATIONS  At this time, I would recommend management of her headache and anxiety symptoms and supportive care per ER. She has good outpatient neurology follow-up and I would recommend following up with them for continuing workup of her chronic dizziness. No further inpatient workup or imaging is needed at this time.  Plan discussed with Dr. Maury ______________________________________________________________________    Signed, Eligio Lav, MD [facility]

## 2023-10-28 NOTE — ED Provider Notes (Signed)
 [facility] Provider Note    Event Date/Time   First MD Initiated Contact with Patient 10/28/23 1107     (approximate)   History   Loss of Consciousness and Chest Pain   HPI  Sarah Tapia is a 68 y.o. female past medical history significant for osteoporosis, history of SVT with ablation, who presents to the emergency department following syncopal episode.  Patient states she was in her normal state of health this morning.  Got up and was at her house.  Then started to feel very dizzy and like she might pass out.  Called her daughter.  She then does not recall what happened and woke up on the ground with EMS.  States that she was having some chest tightness.  States that she still feels dizzy.  Complaining of some heaviness and weakness to the left side.  Does state this has happened in the past and that she is currently being worked up with neurology and has an outpatient EEG scheduled.  Does not currently take any antiepileptic medications.  No tearing chest pain and not on anticoagulation.  Patient's last known well was around 10 AM to 10:30 AM.  No ongoing chest pain at this time.  Denies fever or chills.  No new medications.     Physical Exam   Triage Vital Signs: ED Triage Vitals [10/28/23 1107]  Encounter Vitals Group     BP (!) 182/93     Girls Systolic BP Percentile      Girls Diastolic BP Percentile      Boys Systolic BP Percentile      Boys Diastolic BP Percentile      Pulse Rate 74     Resp 18     Temp 98.6 F (37 C)     Temp Source Oral     SpO2 100 %     Weight 134 lb (60.8 kg)     Height 5' 3 (1.6 m)     Head Circumference      Peak Flow      Pain Score 6     Pain Loc      Pain Education      Exclude from Growth Chart     Most recent vital signs: Vitals:   10/28/23 1400 10/28/23 1430  BP: 137/80 (!) 151/92  Pulse: 65 71  Resp: 19 14  Temp:    SpO2: 100% 100%    Physical Exam Constitutional:      Appearance: She is  well-developed.  HENT:     Head: Atraumatic.  Eyes:     Extraocular Movements: Extraocular movements intact.     Conjunctiva/sclera: Conjunctivae normal.     Pupils: Pupils are equal, round, and reactive to light.  Cardiovascular:     Rate and Rhythm: Regular rhythm.     Pulses:          Radial pulses are 2+ on the right side and 2+ on the left side.       Dorsalis pedis pulses are 2+ on the right side and 2+ on the left side.     Heart sounds: Normal heart sounds.  Pulmonary:     Effort: No respiratory distress.  Abdominal:     General: There is no distension.  Musculoskeletal:        General: Normal range of motion.     Cervical back: Normal range of motion.  Skin:    General: Skin is warm.  Neurological:  Mental Status: She is alert. Mental status is at baseline.     GCS: GCS eye subscore is 4. GCS verbal subscore is 5. GCS motor subscore is 6.     Comments: Decree sensation to left face, arm and leg.  Pronator drift with weakness to the left upper extremity.  4+/5 strength to left lower extremity.  Dysmetria with the right upper extremity.  No nystagmus noted.     IMPRESSION / MDM / ASSESSMENT AND PLAN / ED COURSE  I reviewed the triage vital signs and the nursing notes.  On my evaluation patient had a syncopal episode followed by left-sided weakness.  Last known well approximately 10 to 10:30 AM.  Activated code stroke after my evaluation.  Glucose in the 80s.  Ordered CT scan of the head and CTA however patient has a contrast allergy so only had a CT scan of her head and then an MRI.   EKG  I, Clotilda Punter, the attending physician, personally viewed and interpreted this ECG.   Rate: Normal  Rhythm: Normal sinus  Axis: Normal  Intervals: Normal  ST&T Change: None  No tachycardic or bradycardic dysrhythmias while on cardiac telemetry.  RADIOLOGY I independently reviewed imaging, my interpretation of imaging: CT scan of the head with no signs of intracranial  hemorrhage  MRI of the brain with no findings of acute CVA.  LABS (all labs ordered are listed, but only abnormal results are displayed) Labs interpreted as -    Labs Reviewed  COMPREHENSIVE METABOLIC PANEL WITH GFR - Abnormal; Notable for the following components:      Result Value   Glucose, Bld 101 (*)    All other components within normal limits  ETHANOL  PROTIME-INR  APTT  CBC  DIFFERENTIAL  URINE DRUG SCREEN, QUALITATIVE ([facility] ONLY)  CK  CBG MONITORING, ED  TROPONIN I (HIGH SENSITIVITY)     MDM  Patient was activated code stroke.  Immediately taken back to CT scanner and evaluated by neurology.  CTA was not performed given questionable contrast allergy  CT scan of the head and MRI with no acute CVA.  No further workup was recommended by neurology.  Recommended that if she was back to her baseline she could discharge home and continue to follow-up as an outpatient with her primary neurologist for ongoing workup.  Lab work overall reassuring.  No significant lecture light abnormality.  Troponin is negative.  No signs of rhabdomyolysis.  Alcohol level is negative.  On reevaluation patient states she is feeling much better.  Neurologic exam has significantly improved.  Continues to have intact and symmetric pulses.  On chart review this has been a recurrent episodes and have a very low suspicion for dissection.  Patient states she is feeling much better.  She was doing vestibular physical therapy but she has not done anything recently and she is no longer seeing ENT.  Neurology was concerned for possible peripheral vertigo  Given a prescription for meclizine  and discussed Epley maneuvers.  Discussed follow-up as an outpatient with ENT for possible vestibular vertigo workup.  Discussed ongoing workup and follow-up with primary care physician and neurologist.  No questions at time of discharge.  Able to ambulate in the emergency department with no significant gait instability.      PROCEDURES:  Critical Care performed: yes  .Critical Care  Performed by: Punter Clotilda, MD Authorized by: Punter Clotilda, MD   Critical care provider statement:    Critical care time (minutes):  45  Critical care time was exclusive of:  Separately billable procedures and treating other patients   Critical care was necessary to treat or prevent imminent or life-threatening deterioration of the following conditions: Activated code stroke for TNK consideration.   Critical care was time spent personally by me on the following activities:  Development of treatment plan with patient or surrogate, discussions with consultants, evaluation of patient's response to treatment, examination of patient, ordering and review of laboratory studies, ordering and review of radiographic studies, ordering and performing treatments and interventions, pulse oximetry, re-evaluation of patient's condition and review of old charts   Patient's presentation is most consistent with acute presentation with potential threat to life or bodily function.   MEDICATIONS ORDERED IN ED: Medications  midazolam  (VERSED ) injection 2 mg (2 mg Intravenous Given 10/28/23 1159)  sodium chloride  0.9 % bolus 500 mL (0 mLs Intravenous Stopped 10/28/23 1300)  meclizine  (ANTIVERT ) tablet 25 mg (25 mg Oral Given 10/28/23 1441)    FINAL CLINICAL IMPRESSION(S) / ED DIAGNOSES   Final diagnoses:  Dizzy  Weakness  Syncope, unspecified syncope type     Rx / DC Orders   ED Discharge Orders          Ordered    meclizine  (ANTIVERT ) 25 MG tablet  3 times daily PRN        10/28/23 1439             Note:  This document was prepared using Dragon voice recognition software and may include unintentional dictation errors.   Suzanne Kirsch, MD 10/28/23 [phone]

## 2023-10-28 NOTE — Discharge Instructions (Signed)
 You presented to the emergency department with dizziness, weakness and a headache.  You had an MRI that did not show any signs of a stroke.  Your symptoms improved while you are in the emergency department.  Continue to follow-up with your neurologist for ongoing workup.  You are given a prescription for meclizine  which can help with peripheral dizziness caused from an inner ear issue.  You are also given information of how to perform Epley maneuvers.  You are given information to follow-up with ear nose and throat to discuss possible vestibular physical therapy.  Return to the emergency department for any ongoing or worsening symptoms.  Follow-up closely with your primary care provider.

## 2023-10-28 NOTE — ED Notes (Signed)
 Patient back from MRI with Lang, RN.

## 2023-10-28 NOTE — ED Notes (Signed)
 Dr. Suzanne at bedside; family arrived and patient reported she also had left sided weakness and numbness to left side of face. Family reports patient has had similar episodes recently and neurology would like for her to have an MRI and MRA but patient has not yet scheduled them.

## 2023-10-28 NOTE — ED Notes (Signed)
 Dr. Suzanne at nurses station requesting Code stroke activation with LKW 10/28/2023 at 1000.

## 2023-10-28 NOTE — ED Triage Notes (Signed)
 Patient states sudden onset of chest pain while sitting at home, then developed dizziness and had syncopal episode. Patient describes chest pain as intermittent, also still reports dizziness.

## 2023-10-28 NOTE — ED Notes (Signed)
 Family updated that patient went from CT to MRI with Lang, RN

## 2023-10-28 NOTE — ED Notes (Addendum)
 SABRA

## 2023-10-28 NOTE — Progress Notes (Signed)
  Chaplain On-Call responded to Code Stroke notification at 1117 hours.  The patient was taken to the MRI department for tests.  Chaplain assured ED Staff of availability as needed.  Chaplain Bebe Ardean EMERSON Hershal., [facility]

## 2023-10-28 NOTE — ED Notes (Signed)
 Code  stroke  called to  [facility]  at  11:14am  per  Dr  Suzanne MD

## 2023-10-28 NOTE — Progress Notes (Signed)
 CODE STROKE- PHARMACY COMMUNICATION   Time CODE STROKE called/page received: 1117   Time response to CODE STROKE was made (in person or via phone): in person  Time Stroke Kit retrieved from [facility] (only if needed): TNK not given  Name of Provider/Nurse contacted:Dr. Voncile  Past Medical History:  Diagnosis Date   Anxiety    Gastritis    GERD (gastroesophageal reflux disease)    Hypertension    Pre-diabetes    Prior to Admission medications   Medication Sig Start Date End Date Taking? Authorizing Provider  albuterol  (PROVENTIL  HFA;VENTOLIN  HFA) 108 (90 Base) MCG/ACT inhaler Inhale 1-2 puffs into the lungs every 6 (six) hours as needed for wheezing or shortness of breath.    [provider]  ALPRAZolam  (XANAX ) 0.5 MG tablet Take 0.5 mg by mouth daily as needed for anxiety. 03/25/17   [provider]  amLODipine  (NORVASC ) 5 MG tablet Take 5 mg by mouth daily.    [provider]  Cholecalciferol (VITAMIN D -3) 1000 units CAPS Take 1,000 Units by mouth daily.    [provider]  Multiple Vitamin (MULTIVITAMIN WITH MINERALS) TABS tablet Take 1 tablet by mouth daily.    [provider]  ondansetron  (ZOFRAN -ODT) 4 MG disintegrating tablet Take 1 tablet (4 mg total) by mouth every 8 (eight) hours as needed for nausea or vomiting. 05/17/22   Willo Dunnings, MD  zolpidem (AMBIEN) 5 MG tablet Take 5 mg by mouth at bedtime as needed for sleep. 01/30/17   [provider]    Damien Napoleon ,PharmD Clinical Pharmacist  10/28/2023  11:32 AM

## 2023-10-31 DIAGNOSIS — E538 Deficiency of other specified B group vitamins: Secondary | ICD-10-CM | POA: Diagnosis not present

## 2023-11-17 DIAGNOSIS — L603 Nail dystrophy: Secondary | ICD-10-CM | POA: Diagnosis not present

## 2023-11-17 DIAGNOSIS — S92515D Nondisplaced fracture of proximal phalanx of left lesser toe(s), subsequent encounter for fracture with routine healing: Secondary | ICD-10-CM | POA: Diagnosis not present

## 2023-11-17 DIAGNOSIS — M79672 Pain in left foot: Secondary | ICD-10-CM | POA: Diagnosis not present

## 2024-01-28 NOTE — Progress Notes (Signed)
 Sarah Tapia                                          MRN: [id number]   01/28/2024   The [facility] Quality Team Specialist reviewed this patient medical record for the purposes of chart review for care gap closure. The following were reviewed: chart review for care gap closure-controlling blood pressure.    [facility] Quality Team
